# Patient Record
Sex: Male | Born: 1990 | Hispanic: Refuse to answer | Marital: Single | Smoking: Never smoker
Health system: Southern US, Community
[De-identification: ages and names within clinical notes are randomized; demographics above are authoritative.]

## PROBLEM LIST (undated history)

## (undated) DIAGNOSIS — E119 Type 2 diabetes mellitus without complications: Secondary | ICD-10-CM

## (undated) HISTORY — PX: HERNIA REPAIR: SHX51

## (undated) HISTORY — PX: TONSILLECTOMY: SUR1361

## (undated) HISTORY — PX: VASECTOMY: SHX75

---

## 2019-02-17 ENCOUNTER — Other Ambulatory Visit: Payer: Self-pay

## 2019-02-17 ENCOUNTER — Inpatient Hospital Stay (HOSPITAL_COMMUNITY)
Admission: EM | Admit: 2019-02-17 | Discharge: 2019-02-21 | DRG: 637 | Disposition: A | Discharge status: Home / Self Care | Payer: Self-pay | Attending: Internal Medicine | Admitting: Internal Medicine

## 2019-02-17 ENCOUNTER — Encounter (HOSPITAL_COMMUNITY): Payer: Self-pay | Admitting: *Deleted

## 2019-02-17 DIAGNOSIS — E876 Hypokalemia: Secondary | ICD-10-CM | POA: Diagnosis not present

## 2019-02-17 DIAGNOSIS — E1010 Type 1 diabetes mellitus with ketoacidosis without coma: Principal | ICD-10-CM | POA: Diagnosis present

## 2019-02-17 DIAGNOSIS — E872 Acidosis, unspecified: Secondary | ICD-10-CM | POA: Diagnosis present

## 2019-02-17 DIAGNOSIS — G9341 Metabolic encephalopathy: Secondary | ICD-10-CM | POA: Diagnosis present

## 2019-02-17 DIAGNOSIS — Z8249 Family history of ischemic heart disease and other diseases of the circulatory system: Secondary | ICD-10-CM

## 2019-02-17 DIAGNOSIS — R509 Fever, unspecified: Secondary | ICD-10-CM | POA: Diagnosis not present

## 2019-02-17 DIAGNOSIS — R000 Tachycardia, unspecified: Secondary | ICD-10-CM | POA: Diagnosis present

## 2019-02-17 DIAGNOSIS — N179 Acute kidney failure, unspecified: Secondary | ICD-10-CM | POA: Diagnosis present

## 2019-02-17 DIAGNOSIS — E873 Alkalosis: Secondary | ICD-10-CM | POA: Diagnosis present

## 2019-02-17 DIAGNOSIS — E860 Dehydration: Secondary | ICD-10-CM | POA: Diagnosis present

## 2019-02-17 DIAGNOSIS — Z833 Family history of diabetes mellitus: Secondary | ICD-10-CM

## 2019-02-17 DIAGNOSIS — E871 Hypo-osmolality and hyponatremia: Secondary | ICD-10-CM | POA: Diagnosis present

## 2019-02-17 DIAGNOSIS — K219 Gastro-esophageal reflux disease without esophagitis: Secondary | ICD-10-CM | POA: Diagnosis present

## 2019-02-17 DIAGNOSIS — Z20828 Contact with and (suspected) exposure to other viral communicable diseases: Secondary | ICD-10-CM | POA: Diagnosis present

## 2019-02-17 DIAGNOSIS — M79606 Pain in leg, unspecified: Secondary | ICD-10-CM

## 2019-02-17 HISTORY — DX: Type 2 diabetes mellitus without complications: E11.9

## 2019-02-17 LAB — COMPREHENSIVE METABOLIC PANEL
ALT: 17 U/L (ref 0–44)
AST: 21 U/L (ref 15–41)
Albumin: 3.7 g/dL (ref 3.5–5.0)
Alkaline Phosphatase: 122 U/L (ref 38–126)
BUN: 20 mg/dL (ref 6–20)
CO2: 5 mmol/L — ABNORMAL LOW (ref 22–32)
Calcium: 8.2 mg/dL — ABNORMAL LOW (ref 8.9–10.3)
Chloride: 104 mmol/L (ref 98–111)
Creatinine, Ser: 1.45 mg/dL — ABNORMAL HIGH (ref 0.61–1.24)
GFR calc Af Amer: >60 mL/min (ref >60)
GFR calc non Af Amer: >60 mL/min (ref >60)
Glucose, Bld: 383 mg/dL — ABNORMAL HIGH (ref 70–99)
Potassium: 3.7 mmol/L (ref 3.5–5.1)
Sodium: 131 mmol/L — ABNORMAL LOW (ref 135–145)
Total Bilirubin: 1.6 mg/dL — ABNORMAL HIGH (ref 0.3–1.2)
Total Protein: 6.7 g/dL (ref 6.5–8.1)

## 2019-02-17 LAB — CBC
HCT: 41 % (ref 39.0–52.0)
Hemoglobin: 14.3 g/dL (ref 13.0–17.0)
MCH: 32.1 pg (ref 26.0–34.0)
MCHC: 34.9 g/dL (ref 30.0–36.0)
MCV: 91.9 fL (ref 80.0–100.0)
Platelets: 152 10*3/uL (ref 150–400)
RBC: 4.46 MIL/uL (ref 4.22–5.81)
RDW: 17.9 % — ABNORMAL HIGH (ref 11.5–15.5)
WBC: 10 10*3/uL (ref 4.0–10.5)
nRBC: 0 % (ref 0.0–0.2)

## 2019-02-17 LAB — ETHANOL: Alcohol, Ethyl (B): <10 mg/dL (ref <10)

## 2019-02-17 LAB — BASIC METABOLIC PANEL
Anion gap: 12 (ref 5–15)
Anion gap: 11 (ref 5–15)
BUN: 21 mg/dL — ABNORMAL HIGH (ref 6–20)
BUN: 22 mg/dL — ABNORMAL HIGH (ref 6–20)
CO2: 7 mmol/L — ABNORMAL LOW (ref 22–32)
CO2: 9 mmol/L — ABNORMAL LOW (ref 22–32)
Calcium: 8.1 mg/dL — ABNORMAL LOW (ref 8.9–10.3)
Calcium: 8.7 mg/dL — ABNORMAL LOW (ref 8.9–10.3)
Chloride: 108 mmol/L (ref 98–111)
Chloride: 109 mmol/L (ref 98–111)
Creatinine, Ser: 1.28 mg/dL — ABNORMAL HIGH (ref 0.61–1.24)
Creatinine, Ser: 1.2 mg/dL (ref 0.61–1.24)
GFR calc Af Amer: >60 mL/min (ref >60)
GFR calc Af Amer: >60 mL/min (ref >60)
GFR calc non Af Amer: >60 mL/min (ref >60)
GFR calc non Af Amer: >60 mL/min (ref >60)
Glucose, Bld: 255 mg/dL — ABNORMAL HIGH (ref 70–99)
Glucose, Bld: 180 mg/dL — ABNORMAL HIGH (ref 70–99)
Potassium: 4.7 mmol/L (ref 3.5–5.1)
Potassium: 3 mmol/L — ABNORMAL LOW (ref 3.5–5.1)
Sodium: 127 mmol/L — ABNORMAL LOW (ref 135–145)
Sodium: 129 mmol/L — ABNORMAL LOW (ref 135–145)

## 2019-02-17 LAB — MRSA PCR SCREENING: MRSA by PCR: NEGATIVE

## 2019-02-17 LAB — BLOOD GAS, ARTERIAL
Acid-base deficit: 24 mmol/L — ABNORMAL HIGH (ref 0.0–2.0)
Bicarbonate: 7.1 mmol/L — ABNORMAL LOW (ref 20.0–28.0)
FIO2: 21
O2 Saturation: 60.3 %
Patient temperature: 37
pCO2 arterial: 19 mmHg — CL (ref 32.0–48.0)
pH, Arterial: 7.031 — CL (ref 7.350–7.450)
pO2, Arterial: 36 mmHg — CL (ref 83.0–108.0)

## 2019-02-17 LAB — GLUCOSE, CAPILLARY
Glucose-Capillary: 217 mg/dL — ABNORMAL HIGH (ref 70–99)
Glucose-Capillary: 190 mg/dL — ABNORMAL HIGH (ref 70–99)
Glucose-Capillary: 196 mg/dL — ABNORMAL HIGH (ref 70–99)
Glucose-Capillary: 196 mg/dL — ABNORMAL HIGH (ref 70–99)
Glucose-Capillary: 174 mg/dL — ABNORMAL HIGH (ref 70–99)
Glucose-Capillary: 160 mg/dL — ABNORMAL HIGH (ref 70–99)
Glucose-Capillary: 157 mg/dL — ABNORMAL HIGH (ref 70–99)
Glucose-Capillary: 135 mg/dL — ABNORMAL HIGH (ref 70–99)

## 2019-02-17 LAB — RAPID URINE DRUG SCREEN, HOSP PERFORMED
Amphetamines: NOT DETECTED
Barbiturates: NOT DETECTED
Benzodiazepines: NOT DETECTED
Cocaine: NOT DETECTED
Opiates: NOT DETECTED
Tetrahydrocannabinol: NOT DETECTED

## 2019-02-17 LAB — CBG MONITORING, ED
Glucose-Capillary: 425 mg/dL — ABNORMAL HIGH (ref 70–99)
Glucose-Capillary: 392 mg/dL — ABNORMAL HIGH (ref 70–99)
Glucose-Capillary: 362 mg/dL — ABNORMAL HIGH (ref 70–99)
Glucose-Capillary: 226 mg/dL — ABNORMAL HIGH (ref 70–99)
Glucose-Capillary: 255 mg/dL — ABNORMAL HIGH (ref 70–99)
Glucose-Capillary: 236 mg/dL — ABNORMAL HIGH (ref 70–99)

## 2019-02-17 LAB — URINALYSIS, ROUTINE W REFLEX MICROSCOPIC
Bacteria, UA: NONE SEEN
Bilirubin Urine: NEGATIVE
Glucose, UA: >=500 mg/dL — AB
Ketones, ur: 80 mg/dL — AB
Leukocytes,Ua: NEGATIVE
Nitrite: NEGATIVE
Protein, ur: 100 mg/dL — AB
Specific Gravity, Urine: 1.021 (ref 1.005–1.030)
pH: 5 (ref 5.0–8.0)

## 2019-02-17 LAB — HIV ANTIBODY (ROUTINE TESTING W REFLEX): HIV Screen 4th Generation wRfx: NONREACTIVE

## 2019-02-17 LAB — HEMOGLOBIN A1C
Hgb A1c MFr Bld: 12 % — ABNORMAL HIGH (ref 4.8–5.6)
Mean Plasma Glucose: 297.7 mg/dL

## 2019-02-17 LAB — LACTIC ACID, PLASMA: Lactic Acid, Venous: 1.7 mmol/L (ref 0.5–1.9)

## 2019-02-17 LAB — TSH: TSH: 1.37 u[IU]/mL (ref 0.350–4.500)

## 2019-02-17 LAB — MAGNESIUM: Magnesium: 2.1 mg/dL (ref 1.7–2.4)

## 2019-02-17 LAB — SARS CORONAVIRUS 2 (TAT 6-24 HRS): SARS Coronavirus 2: NEGATIVE

## 2019-02-17 LAB — TROPONIN I (HIGH SENSITIVITY): Troponin I (High Sensitivity): 5 ng/L (ref <18)

## 2019-02-17 LAB — PHOSPHORUS: Phosphorus: 3.2 mg/dL (ref 2.5–4.6)

## 2019-02-17 MED ORDER — SODIUM CHLORIDE 0.9 % IV BOLUS
1000.0000 mL | Freq: Once | INTRAVENOUS | Status: AC
  Administered 2019-02-17: 1000 mL via INTRAVENOUS

## 2019-02-17 MED ORDER — INSULIN REGULAR(HUMAN) IN NACL 100-0.9 UT/100ML-% IV SOLN
INTRAVENOUS | Status: DC
  Administered 2019-02-17: 3.3 [IU]/h via INTRAVENOUS

## 2019-02-17 MED ORDER — PANTOPRAZOLE SODIUM 40 MG PO TBEC
40.0000 mg | DELAYED_RELEASE_TABLET | Freq: Every day | ORAL | Status: DC
  Administered 2019-02-17 – 2019-02-21 (×3): 40 mg via ORAL
  Filled 2019-02-17 (×4): qty 1

## 2019-02-17 MED ORDER — HEPARIN SODIUM (PORCINE) 5000 UNIT/ML IJ SOLN
5000.0000 [IU] | Freq: Three times a day (TID) | INTRAMUSCULAR | Status: DC
  Administered 2019-02-17 – 2019-02-21 (×3): 5000 [IU] via SUBCUTANEOUS
  Filled 2019-02-17 (×6): qty 1

## 2019-02-17 MED ORDER — INSULIN REGULAR(HUMAN) IN NACL 100-0.9 UT/100ML-% IV SOLN
INTRAVENOUS | Status: DC
  Administered 2019-02-17: 8 [IU]/h via INTRAVENOUS
  Administered 2019-02-17: 7.8 [IU]/h via INTRAVENOUS
  Administered 2019-02-17: 6 [IU]/h via INTRAVENOUS
  Administered 2019-02-19: 3.8 [IU]/h via INTRAVENOUS
  Filled 2019-02-17 (×2): qty 100

## 2019-02-17 MED ORDER — MAGNESIUM SULFATE 2 GM/50ML IV SOLN
2.0000 g | INTRAVENOUS | Status: AC
  Administered 2019-02-17: 2 g via INTRAVENOUS
  Filled 2019-02-17: qty 50

## 2019-02-17 MED ORDER — ONDANSETRON HCL 4 MG/2ML IJ SOLN: 4.0000 mg | Freq: Four times a day (QID) | INTRAMUSCULAR | Status: DC | PRN

## 2019-02-17 MED ORDER — DEXTROSE-NACL 5-0.45 % IV SOLN
INTRAVENOUS | Status: DC
  Administered 2019-02-17: 17:00:00 via INTRAVENOUS

## 2019-02-17 MED ORDER — POTASSIUM CHLORIDE 10 MEQ/100ML IV SOLN
10.0000 meq | INTRAVENOUS | Status: DC
  Administered 2019-02-17: 10 meq via INTRAVENOUS
  Filled 2019-02-17: qty 100

## 2019-02-17 MED ORDER — SODIUM CHLORIDE 0.9 % IV SOLN: Freq: Once | INTRAVENOUS | Status: DC

## 2019-02-17 MED ORDER — POTASSIUM CHLORIDE 10 MEQ/100ML IV SOLN
10.0000 meq | INTRAVENOUS | Status: AC
  Administered 2019-02-17: 10 meq via INTRAVENOUS
  Filled 2019-02-17: qty 100

## 2019-02-17 MED ORDER — SODIUM CHLORIDE 0.9 % IV SOLN
INTRAVENOUS | Status: DC
  Administered 2019-02-17: 12:00:00 via INTRAVENOUS

## 2019-02-17 MED ORDER — ACETAMINOPHEN 325 MG PO TABS: 650.0000 mg | ORAL_TABLET | Freq: Four times a day (QID) | ORAL | Status: DC | PRN

## 2019-02-17 MED ORDER — CHLORHEXIDINE GLUCONATE CLOTH 2 % EX PADS
6.0000 | MEDICATED_PAD | Freq: Every day | CUTANEOUS | Status: DC
  Administered 2019-02-17 – 2019-02-21 (×5): 6 via TOPICAL

## 2019-02-17 NOTE — H&P (Signed)
History and Physical    Bronco Mcgrory VPX:[id number] DOB: Jul 23, 1990 DOA: 02/17/2019  Referring MD/NP/PA: Dr. Sabra Heck PCP: Patient, No Pcp Per  Patient coming from: Home  Chief Complaint: Feeling confused, polydipsia, polyphagia, polyuria.  HPI: Becky Berberian is a 28 y.o. male without past medical history; who presented to the emergency department secondary to not feeling well and recent elevated blood sugars.  Patient reports never been diagnosed with diabetes, he reports tachypnea, severe dry mouth, polyuria and polydipsia.  Patient reports having some headaches and not feeling well.  He denies chest pain, fever, chills, shortness of breath, nausea, vomiting, dysuria, hematuria, focal weakness or any other complaints.  Patient reports been in the process of traveling through town trying to get to his sister house in [place]; but felt significantly ill slightly confused, he called paramedics secondary to these ongoing conditions.  When EMS arrived they found patients blood sugar was found in the mid 300 range.  He was tachypneic and presented significant dry mouth.  Patient reports not taking the medications and express all his symptoms have started in the last 24 to 36 hours prior to admission.  Patient denies any alcohol, drug use, tobacco abuse or allergies to any medications. In the ED patient was found to be in DKA, CO2 was 5, blood sugar in the 380 range and anion gap of 26.  IV fluids and insulin drip was initiated.  [facility] was contacted to admit patient for further evaluation and management of newly diagnosed diabetes with DKA presentation.  Past Medical/Surgical History: Past Medical History:  Diagnosis Date  . Diabetes mellitus without complication [facility])     Past Surgical History:  Procedure Laterality Date  . HERNIA REPAIR    . TONSILLECTOMY    . VASECTOMY      Social History:  reports that he has never smoked. He has never used smokeless tobacco. He reports previous alcohol use.  He reports previous drug use.  Allergies: No Known Allergies  Family History:  After discussing with the patient he expressed a family history of hypertension and some second degree history of diabetes.  Prior to Admission medications   Not on File    Review of Systems:  Negative except as otherwise mentioned in HPI.    Physical Exam: Vitals:   02/17/19 0656 02/17/19 0800 02/17/19 0930  BP:  132/86 119/80  Pulse:  (!) 122 (!) 122  Resp:  (!) 28 (!) 30  SpO2:  96% 99%  Weight: 61.7 kg    Height: 5\' 6"  (1.676 m)      Constitutional: Mildly confused (regarding insight), with visible tachypnea, no chest pain, no nausea, no vomiting.  Afebrile. Eyes: PERRL, lids and conjunctivae normal, no icterus, no nystagmus. ENMT: Mucous membranes dry on exam. Posterior pharynx clear of any exudate or lesions. Neck: normal, supple, no masses, no thyromegaly, no JVD. Respiratory: clear to auscultation bilaterally, no wheezing, no crackles.  Positive tachypnea. No accessory muscle use.  Cardiovascular: Sinus tachycardia, no murmurs, rubs or gallops. No extremity edema. 2+ pedal pulses. No carotid bruits.  Abdomen: no tenderness, no masses palpated. No hepatosplenomegaly. Bowel sounds positive.  Musculoskeletal: no clubbing / cyanosis. No joint deformity upper and lower extremities. Good ROM, no contractures. Normal muscle tone.  Skin: no rashes, lesions, ulcers. No induration Neurologic: CN 2-12 grossly intact. Sensation intact, DTR normal. Strength 5/5 in all 4.  Psychiatric: slightly impaired insight currently. Alert and oriented x 3. Normal mood.    Labs on Admission: I have  personally reviewed the following labs and imaging studies  CBC: Recent Labs  Lab 02/17/19 0828  WBC 10.0  HGB 14.3  HCT 41.0  MCV 91.9  PLT 152   Basic Metabolic Panel: Recent Labs  Lab 02/17/19 0742  NA 131*  K 3.7  CL 104  CO2 5*  GLUCOSE 383*  BUN 20  CREATININE 1.45*  CALCIUM 8.2*  MG 2.1    GFR: Estimated Creatinine Clearance: 66.8 mL/min (A) (by C-G formula based on SCr of 1.45 mg/dL (H)).   Liver Function Tests: Recent Labs  Lab 02/17/19 0742  AST 21  ALT 17  ALKPHOS 122  BILITOT 1.6*  PROT 6.7  ALBUMIN 3.7   CBG: Recent Labs  Lab 02/17/19 0730 02/17/19 1004  GLUCAP 425* 392*   Urine analysis:    Component Value Date/Time   COLORURINE STRAW (A) 02/17/2019 0721   APPEARANCEUR CLEAR 02/17/2019 0721   LABSPEC 1.021 02/17/2019 0721   PHURINE 5.0 02/17/2019 0721   GLUCOSEU >=500 (A) 02/17/2019 0721   HGBUR MODERATE (A) 02/17/2019 0721   BILIRUBINUR NEGATIVE 02/17/2019 0721   KETONESUR 80 (A) 02/17/2019 0721   PROTEINUR 100 (A) 02/17/2019 0721   NITRITE NEGATIVE 02/17/2019 0721   LEUKOCYTESUR NEGATIVE 02/17/2019 0721    Recent Results (from the past 240 hour(s))  Blood culture (routine x 2)     Status: None (Preliminary result)   Collection Time: 02/17/19  8:18 AM   Specimen: BLOOD  Result Value Ref Range Status   Specimen Description BLOOD LEFT ARM  Final   Special Requests   Final    BOTTLES DRAWN AEROBIC AND ANAEROBIC Blood Culture adequate volume Performed at [facility], [address] [place], [place] [address]    Culture PENDING  Incomplete   Report Status PENDING  Incomplete  Blood culture (routine x 2)     Status: None (Preliminary result)   Collection Time: 02/17/19  8:28 AM   Specimen: BLOOD  Result Value Ref Range Status   Specimen Description BLOOD LEFT HAND  Final   Special Requests   Final    BOTTLES DRAWN AEROBIC ONLY Blood Culture results may not be optimal due to an inadequate volume of blood received in culture bottles Performed at [facility], [address] [place], [place] [address]    Culture PENDING  Incomplete   Report Status PENDING  Incomplete     Radiological Exams on Admission: No results found.  EKG: Independently reviewed.  Sinus tachycardia, normal QT.  Normal axis.  Nonspecific T wave abnormalities.   Assessment/Plan 1-Newly diagnosed diabetes with hyperglycemia and DKA -Most likely type 1 diabetes -Patient will receive aggressive IV fluids and IV insulin therapy -N.p.o. -Check TSH and troponin -Check A1c, PO4 and Mg level -On presentation CO2 was 5, CBGs in the 380 range and anion gap 26 -Will follow DKA protocol to transition patient off insulin drip. -COVID-19 test neg   2-mild renal insufficiency -In the setting of dehydration from DKA process -Continue aggressive fluid resuscitation and follow renal function trend.  3-hyponatremia -In the setting of dehydration and hyperglycemia -Continue IV fluids -Follow electrolytes.  4-GI prophylaxis -Started on PPI.  5-mild metabolic encephalopathy in the setting of hyperglycemia and DKA -Continue treatment for DKA process and follow mentation. -Patient able to follow commands appropriately. -No headaches, no photophobia, no meningismus.  6-severe acidosis -ABG demonstrating a pH of 7.0 -Secondary to DKA process -Patient with compensatory respiratory alkalosis -Continue treatment for DKA and follow clinical response.  7-sinus tachycardia -  In the setting of dehydration and acute DKA process -Continue treatment with IV fluids and insulin treatment as mentioned above -Telemetry monitoring in place.   DVT prophylaxis: heparin  Code Status: Full Code Family Communication: no family at bedside; ED physician discussed plan of care with sister over the phone. Disposition Plan: home once medically stable.  Consults called: none  Admission status: Inpatient, stepdown unit; length of stay more than 2 midnights.   Time Spent: 70 minutes.  Vassie Loll MD [facility] Pager [phone]  02/17/2019, 10:24 AM

## 2019-02-17 NOTE — ED Triage Notes (Signed)
Pt brought in by [facility] for c/o altered loc; pt was picked up at a local hotel; pt appears tachypneic and anxious; pt denies any pain at this time; pt's cbg with ems was 240

## 2019-02-17 NOTE — ED Notes (Signed)
Pt does not want to stay. States he doesn't like to take medications and this is all due to dietary reasons. Have explained to him DKA extensively. Have consulted Dr. Sabra Heck.

## 2019-02-17 NOTE — ED Notes (Signed)
Urinal at bedside.  

## 2019-02-17 NOTE — ED Provider Notes (Signed)
[facility] EMERGENCY DEPARTMENT Provider Note   CSN: [id number] Arrival date & time: 02/17/19  0645     History   Chief Complaint Chief Complaint  Patient presents with  . Altered Mental Status    HPI Victor Parsons is a 28 y.o. male.     HPI  This patient is a 28 year old male, he presents to the hospital in the care of the paramedics after he called out for feeling ill.  Apparently the patient states to me that he has never been diagnosed with diabetes other than self diagnosing himself.  He reports that he has been reading about it, he feels like he has "myelin sheath problem" and will often have pain in his feet.  However the primary complaint tonight is that the patient feels like he is having tachypnea, he is having a severe dry mouth and is having to urinate more frequently with some discomfort with urination.  He denies chest pain, denies abdominal pain, he states he has a headache, feels severely dehydrated.  He is currently traveling through town trying to get to his sister's house in [place] and was staying at a hotel where he was found by the paramedics in this condition.  He was reportedly hyperglycemic at 240 or thereabouts, reportedly tachypneic and dry in the mouth, no medications were given prehospital.  He states that his symptoms of started in the last 24 hours.  He denies taking any daily medications, denies any prior diagnosis of any chronic medical conditions, states that he has had a vasectomy, tonsillectomy and an inguinal hernia repair in the past.  Denies taking any daily medicines, denies any allergies to medications.  Denies any alcohol or drug use and does not smoke cigarettes.  Past Medical History:  Diagnosis Date  . Diabetes mellitus without complication (HCC)     There are no active problems to display for this patient.   Past Surgical History:  Procedure Laterality Date  . HERNIA REPAIR    . TONSILLECTOMY    . VASECTOMY          Home  Medications    Prior to Admission medications   Not on File    Family History History reviewed. No pertinent family history.  Social History Social History   Tobacco Use  . Smoking status: Never Smoker  . Smokeless tobacco: Never Used  Substance Use Topics  . Alcohol use: Not Currently  . Drug use: Not Currently     Allergies   Patient has no known allergies.   Review of Systems Review of Systems  All other systems reviewed and are negative.    Physical Exam Updated Vital Signs Ht 1.676 m (5\' 6" )   Wt 61.7 kg   BMI 21.95 kg/m   Physical Exam Vitals signs and nursing note reviewed.  Constitutional:      General: He is in acute distress.     Appearance: He is well-developed.  HENT:     Head: Normocephalic and atraumatic.     Mouth/Throat:     Mouth: Mucous membranes are dry.     Pharynx: No oropharyngeal exudate.     Comments: Very dry mucous membranes Eyes:     General: No scleral icterus.       Right eye: No discharge.        Left eye: No discharge.     Conjunctiva/sclera: Conjunctivae normal.     Pupils: Pupils are equal, round, and reactive to light.  Neck:  Musculoskeletal: Normal range of motion and neck supple.     Thyroid: No thyromegaly.     Vascular: No JVD.  Cardiovascular:     Rate and Rhythm: Regular rhythm. Tachycardia present.     Heart sounds: Normal heart sounds. No murmur. No friction rub. No gallop.   Pulmonary:     Effort: Respiratory distress present.     Breath sounds: Normal breath sounds. No wheezing or rales.  Abdominal:     General: Bowel sounds are normal. There is no distension.     Palpations: Abdomen is soft. There is no mass.     Tenderness: There is no abdominal tenderness.     Comments: Nontender abdomen, no hepatosplenomegaly  Musculoskeletal: Normal range of motion.        General: No tenderness.     Comments: Compartments are soft and joints are supple diffusely, there is no tenderness, no obvious deformities   Lymphadenopathy:     Cervical: No cervical adenopathy.  Skin:    General: Skin is warm and dry.     Findings: No erythema or rash.     Comments: Dry skin, no rashes seen  Neurological:     Mental Status: He is alert.     Coordination: Coordination normal.     Comments: The patient is able to sit up and move around in the bed, he speaks but in short and sentences secondary to the tachypnea, has normal coordination and follows commands without difficulty.  Psychiatric:        Behavior: Behavior normal.      ED Treatments / Results  Labs (all labs ordered are listed, but only abnormal results are displayed) Labs Reviewed  COMPREHENSIVE METABOLIC PANEL - Abnormal; Notable for the following components:      Result Value   Sodium 131 (*)    CO2 5 (*)    Glucose, Bld 383 (*)    Creatinine, Ser 1.45 (*)    Calcium 8.2 (*)    Total Bilirubin 1.6 (*)    All other components within normal limits  BLOOD GAS, ARTERIAL - Abnormal; Notable for the following components:   pH, Arterial 7.031 (*)    pCO2 arterial 19.0 (*)    pO2, Arterial 36.0 (*)    Bicarbonate 7.1 (*)    Acid-base deficit 24.0 (*)    All other components within normal limits  CBC - Abnormal; Notable for the following components:   RDW 17.9 (*)    All other components within normal limits  CBG MONITORING, ED - Abnormal; Notable for the following components:   Glucose-Capillary 425 (*)    All other components within normal limits  CULTURE, BLOOD (ROUTINE X 2)  CULTURE, BLOOD (ROUTINE X 2)  URINE CULTURE  SARS CORONAVIRUS 2 (TAT 6-24 HRS)  LACTIC ACID, PLASMA  MAGNESIUM  ETHANOL  URINALYSIS, ROUTINE W REFLEX MICROSCOPIC  VOLATILES,BLD-ACETONE,ETHANOL,ISOPROP,METHANOL  OSMOLALITY  RAPID URINE DRUG SCREEN, HOSP PERFORMED    EKG EKG Interpretation  Date/Time:  Thursday February 17 2019 07:35:06 EDT Ventricular Rate:  124 PR Interval:    QRS Duration: 105 QT Interval:  291 QTC Calculation: 418 R Axis:   85  Text Interpretation:  Sinus tachycardia Nonspecific T abnrm, anterolateral leads Baseline wander in lead(s) I aVR V2 No old tracing to compare Confirmed by Eber Hong ([id number]) on 02/17/2019 7:50:15 AM   Radiology No results found.  Procedures .Critical Care Performed by: Eber Hong, MD Authorized by: Eber Hong, MD   Critical care provider statement:  Critical care time (minutes):  35   Critical care time was exclusive of:  Separately billable procedures and treating other patients and teaching time   Critical care was necessary to treat or prevent imminent or life-threatening deterioration of the following conditions:  Endocrine crisis   Critical care was time spent personally by me on the following activities:  Blood draw for specimens, development of treatment plan with patient or surrogate, discussions with consultants, evaluation of patient's response to treatment, examination of patient, obtaining history from patient or surrogate, ordering and performing treatments and interventions, ordering and review of laboratory studies, ordering and review of radiographic studies, pulse oximetry, re-evaluation of patient's condition and review of old charts   (including critical care time)  Medications Ordered in ED Medications  sodium chloride 0.9 % bolus 1,000 mL (has no administration in time range)  sodium chloride 0.9 % bolus 1,000 mL (has no administration in time range)     Initial Impression / Assessment and Plan / ED Course  I have reviewed the triage vital signs and the nursing notes.  Pertinent labs & imaging results that were available during my care of the patient were reviewed by me and considered in my medical decision making (see chart for details).  Clinical Course as of Feb 17 1031  Thu Feb 17, 2019  5361 My interpretation of the EKG shows nonspecific T waves, tachycardia consistent with dehydrated state likely some potassium abnormality, CBG reviewed showing  over 400, more likely to be DKA at this point, other labs pending, IV fluids infusing at this time   [BM]  0849 The VBG is consistent with an acidotic state, the CO2 is 19, pH is 7.03, PO2 of 36, bicarbonate is 7.1.  There is a large acid-base deficit all consistent with acidosis.  Pending potassium prior to starting insulin, IV fluids are being given, tachycardia is somewhat improving   [BM]  0912 K is 3.7, K will be replaced - Cr of 1.45, AKI  Glucose ~ 400 and CO2, < 5 c/w severe acidosis.   [BM]  0925 CO2(!): 5 [BM]  0946 I discussed the patients care with his sister in [place] - she reports that she was asking him to come up there to take care of him as they think that he has some type of Autism or underlyilng mental health disorder based on some mild behavioural abnormalities (doesn't specify), she will be able to come here to pick him up after stabilized during hospitalization - the patient is expressing his desire to go home and handle this with dietary modifications, he does not understand the gravity of the situation thus I had a very long discussion with the patient regarding risks benefits and the lack of alternatives for a safe outcome.  He is now agreeable to stay, paged hospitalist   [BM]    Clinical Course User Index [BM] Noemi Chapel, MD       It appears that the patient is having a metabolic problem, he is very tachypneic, he appears very dry in the mouth and by all accounts this would be consistent with an acidotic state, that being said he is not a known diabetic, states he does not take any medications for that and has only recently "self diagnosed".  At this time the patient will need IV fluid resuscitation as well as a metabolic work-up, he may in fact be metabolically ill, will get ABG, metabolic panel, EKG, cardiac monitoring, IV fluids, insulin as needed if in fact  this ends up being DKA.  We will also check for volatile acids and serum osmolality.  The patient is agreeable  with the plan.  The patient's significant acidosis will be treated with IV fluids insulin drip, replacement of potassium and admission to high level of care.  He continues to be tachycardic, acidotic and ill-appearing, will likely need stepdown  Critical care provided  D/w Dr. Gwenlyn PerkingMadera who will admit  Vitals:   02/17/19 0656 02/17/19 0800  BP:  132/86  Pulse:  (!) 122  Resp:  (!) 28  SpO2:  96%  Weight: 61.7 kg   Height: 1.676 m (5\' 6" )      Final Clinical Impressions(s) / ED Diagnoses   Final diagnoses:  Diabetic ketoacidosis without coma associated with type 1 diabetes mellitus (HCC)  AKI (acute kidney injury) (HCC)      Eber HongMiller, Bauer Ausborn, MD 02/17/19 1032

## 2019-02-17 NOTE — ED Notes (Signed)
Crit CRITICAL VALUE ALERT  Critical Value:  pH 7.031, pCO2 19, PO2 36   Date & Time Notied:  02/17/2019 0834  Provider Notified: Dr. Sabra Heck   Orders Received/Actions taken: None yet

## 2019-02-18 LAB — BASIC METABOLIC PANEL
Anion gap: 7 (ref 5–15)
Anion gap: 9 (ref 5–15)
Anion gap: 9 (ref 5–15)
Anion gap: 9 (ref 5–15)
Anion gap: 9 (ref 5–15)
BUN: 16 mg/dL (ref 6–20)
BUN: 19 mg/dL (ref 6–20)
BUN: 20 mg/dL (ref 6–20)
BUN: 24 mg/dL — ABNORMAL HIGH (ref 6–20)
BUN: 25 mg/dL — ABNORMAL HIGH (ref 6–20)
CO2: 11 mmol/L — ABNORMAL LOW (ref 22–32)
CO2: 11 mmol/L — ABNORMAL LOW (ref 22–32)
CO2: 13 mmol/L — ABNORMAL LOW (ref 22–32)
CO2: 13 mmol/L — ABNORMAL LOW (ref 22–32)
CO2: 15 mmol/L — ABNORMAL LOW (ref 22–32)
Calcium: 8.8 mg/dL — ABNORMAL LOW (ref 8.9–10.3)
Calcium: 8.8 mg/dL — ABNORMAL LOW (ref 8.9–10.3)
Calcium: 8.9 mg/dL (ref 8.9–10.3)
Calcium: 9.2 mg/dL (ref 8.9–10.3)
Calcium: 9.6 mg/dL (ref 8.9–10.3)
Chloride: 101 mmol/L (ref 98–111)
Chloride: 102 mmol/L (ref 98–111)
Chloride: 104 mmol/L (ref 98–111)
Chloride: 106 mmol/L (ref 98–111)
Chloride: 109 mmol/L (ref 98–111)
Creatinine, Ser: 1.26 mg/dL — ABNORMAL HIGH (ref 0.61–1.24)
Creatinine, Ser: 1.28 mg/dL — ABNORMAL HIGH (ref 0.61–1.24)
Creatinine, Ser: 1.31 mg/dL — ABNORMAL HIGH (ref 0.61–1.24)
Creatinine, Ser: 1.31 mg/dL — ABNORMAL HIGH (ref 0.61–1.24)
Creatinine, Ser: 1.31 mg/dL — ABNORMAL HIGH (ref 0.61–1.24)
GFR calc Af Amer: >60 mL/min (ref >60)
GFR calc Af Amer: >60 mL/min (ref >60)
GFR calc Af Amer: >60 mL/min (ref >60)
GFR calc Af Amer: >60 mL/min (ref >60)
GFR calc Af Amer: >60 mL/min (ref >60)
GFR calc non Af Amer: >60 mL/min (ref >60)
GFR calc non Af Amer: >60 mL/min (ref >60)
GFR calc non Af Amer: >60 mL/min (ref >60)
GFR calc non Af Amer: >60 mL/min (ref >60)
GFR calc non Af Amer: >60 mL/min (ref >60)
Glucose, Bld: 118 mg/dL — ABNORMAL HIGH (ref 70–99)
Glucose, Bld: 133 mg/dL — ABNORMAL HIGH (ref 70–99)
Glucose, Bld: 145 mg/dL — ABNORMAL HIGH (ref 70–99)
Glucose, Bld: 163 mg/dL — ABNORMAL HIGH (ref 70–99)
Glucose, Bld: 184 mg/dL — ABNORMAL HIGH (ref 70–99)
Potassium: 2.7 mmol/L — CL (ref 3.5–5.1)
Potassium: 2.9 mmol/L — ABNORMAL LOW (ref 3.5–5.1)
Potassium: 2.9 mmol/L — ABNORMAL LOW (ref 3.5–5.1)
Potassium: 3.7 mmol/L (ref 3.5–5.1)
Potassium: 3.7 mmol/L (ref 3.5–5.1)
Sodium: 121 mmol/L — ABNORMAL LOW (ref 135–145)
Sodium: 124 mmol/L — ABNORMAL LOW (ref 135–145)
Sodium: 126 mmol/L — ABNORMAL LOW (ref 135–145)
Sodium: 128 mmol/L — ABNORMAL LOW (ref 135–145)
Sodium: 129 mmol/L — ABNORMAL LOW (ref 135–145)

## 2019-02-18 LAB — GLUCOSE, CAPILLARY
Glucose-Capillary: 113 mg/dL — ABNORMAL HIGH (ref 70–99)
Glucose-Capillary: 124 mg/dL — ABNORMAL HIGH (ref 70–99)
Glucose-Capillary: 127 mg/dL — ABNORMAL HIGH (ref 70–99)
Glucose-Capillary: 133 mg/dL — ABNORMAL HIGH (ref 70–99)
Glucose-Capillary: 136 mg/dL — ABNORMAL HIGH (ref 70–99)
Glucose-Capillary: 139 mg/dL — ABNORMAL HIGH (ref 70–99)
Glucose-Capillary: 144 mg/dL — ABNORMAL HIGH (ref 70–99)
Glucose-Capillary: 145 mg/dL — ABNORMAL HIGH (ref 70–99)
Glucose-Capillary: 146 mg/dL — ABNORMAL HIGH (ref 70–99)
Glucose-Capillary: 147 mg/dL — ABNORMAL HIGH (ref 70–99)
Glucose-Capillary: 152 mg/dL — ABNORMAL HIGH (ref 70–99)
Glucose-Capillary: 159 mg/dL — ABNORMAL HIGH (ref 70–99)
Glucose-Capillary: 166 mg/dL — ABNORMAL HIGH (ref 70–99)
Glucose-Capillary: 167 mg/dL — ABNORMAL HIGH (ref 70–99)
Glucose-Capillary: 169 mg/dL — ABNORMAL HIGH (ref 70–99)
Glucose-Capillary: 174 mg/dL — ABNORMAL HIGH (ref 70–99)
Glucose-Capillary: 177 mg/dL — ABNORMAL HIGH (ref 70–99)
Glucose-Capillary: 179 mg/dL — ABNORMAL HIGH (ref 70–99)
Glucose-Capillary: 184 mg/dL — ABNORMAL HIGH (ref 70–99)
Glucose-Capillary: 195 mg/dL — ABNORMAL HIGH (ref 70–99)
Glucose-Capillary: 198 mg/dL — ABNORMAL HIGH (ref 70–99)
Glucose-Capillary: 203 mg/dL — ABNORMAL HIGH (ref 70–99)

## 2019-02-18 LAB — URINE CULTURE: Culture: NO GROWTH

## 2019-02-18 MED ORDER — POTASSIUM CHLORIDE 10 MEQ/100ML IV SOLN
10.0000 meq | INTRAVENOUS | Status: AC
  Administered 2019-02-18 – 2019-02-19 (×4): 10 meq via INTRAVENOUS
  Filled 2019-02-18 (×4): qty 100

## 2019-02-18 MED ORDER — POTASSIUM CHLORIDE CRYS ER 20 MEQ PO TBCR
40.0000 meq | EXTENDED_RELEASE_TABLET | Freq: Once | ORAL | Status: AC
  Administered 2019-02-18: 40 meq via ORAL
  Filled 2019-02-18: qty 2

## 2019-02-18 MED ORDER — LIVING WELL WITH DIABETES BOOK
Freq: Once | Status: AC
  Administered 2019-02-18: 09:00:00

## 2019-02-18 MED ORDER — POTASSIUM CHLORIDE 10 MEQ/100ML IV SOLN
10.0000 meq | INTRAVENOUS | Status: AC
  Administered 2019-02-18 (×4): 10 meq via INTRAVENOUS
  Filled 2019-02-18 (×4): qty 100

## 2019-02-18 MED ORDER — DEXTROSE-NACL 5-0.9 % IV SOLN
INTRAVENOUS | Status: DC
  Administered 2019-02-18 – 2019-02-19 (×3): via INTRAVENOUS

## 2019-02-18 NOTE — Discharge Instructions (Signed)
Glucose Products:  ReliOn glucose products raise low blood sugar fast. Tablets are free of fat, caffeine, sodium and gluten. They are portable and easy to carry, making it easier for people with diabetes to BE PREPARED for lows.  Glucose Tablets Available in 6 flavors  10 ct...................................... $1.00  50 ct...................................... $3.98 Glucose Shot..................................$1.48 Glucose Gel....................................$3.44  Alcohol Swabs Alcohol swabs are used to sterilize your injection site. All of our swabs are individually wrapped for maximum safety, convenience and moisture retention. ReliOn Alcohol Swabs  100 ct Swabs..............................$1.00  400 ct Swabs..............................$3.74  Lancets ReliOn offers three lancet options conveniently designed to work with almost every lancing device. Each features a protective disk, which guarantees sterility before testing. ReliOn Lancets  100 ct Lancets $1.56  200 ct Lancets $2.64 Available in Ultra-Thin, Thin & Micro-Thin ReliOn 2-IN-1 Lancing Device  50 ct Lancets..................................... $3.44 Available in 30 gauge and 25 gauge ReliOn Lancing Device....................$5.84  Blood Glucose Monitors ReliOn offers a full range of blood glucose testing options to provide an accurate, affordable system that meets each person's unique needs and preferences. Prime Meter....................................... $9.00 Prime Test Strips  25 test strips.................................... $5.00  50 test strips.................................... $9.00  100 test strips.................................$17.88 Premier [employer] Meter    $18.98 Premier Voice Meter  .  $14.98 Premier Test Strips  50 test strips.................................... $9.00  100 test strips.................................$17.88 Premier [employer] Kit     $19.44 Kit includes:  50 test strips  10 lancets  Lancing device  Carry case  Ketone Test Strips  50 test strips  ..  $6.64  Human Insulin  Novolin/ReliOn (recombinant DNA origin) is manufactured for [employer] by [employer] Insulin* with Vial..........$24.88 Available in N, R, 70/30 Novolin/ReliOn Insulin Pens*    $42.88 Available in N, R, 70/30  Insulin Delivery ReliOn syringes and pen needles provide precision technology, comfort and accuracy in insulin delivery at affordable prices. ReliOn Pen Needles*  50 ct....................................................$9.00 Available in 22m, 650m 19m61m 95m60mliOn Insulin Syringes*   100 ct . $12.58 Available in 29G, 30G & 31G (3/10cc, 1/2cc & 1cc units)

## 2019-02-18 NOTE — Progress Notes (Signed)
Mid level aware of patient's potassium.

## 2019-02-18 NOTE — Progress Notes (Signed)
PROGRESS NOTE    Victor Parsons  ZJQ:[id number] DOB: 08-30-1990 DOA: 02/17/2019 PCP: Patient, No Pcp Per     Brief Narrative:  28 y.o. male without past medical history; who presented to the emergency department secondary to not feeling well and recent elevated blood sugars.  Patient reports never been diagnosed with diabetes, he reports tachypnea, severe dry mouth, polyuria and polydipsia.  Patient reports having some headaches and not feeling well.  He denies chest pain, fever, chills, shortness of breath, nausea, vomiting, dysuria, hematuria, focal weakness or any other complaints.  Patient reports been in the process of traveling through town trying to get to his sister house in [place]; but felt significantly ill slightly confused, he called paramedics secondary to these ongoing conditions.  When EMS arrived they found patients blood sugar was found in the mid 300 range.  He was tachypneic and presented significant dry mouth.  Patient reports not taking the medications and express all his symptoms have started in the last 24 to 36 hours prior to admission.  Patient denies any alcohol, drug use, tobacco abuse or allergies to any medications. In the ED patient was found to be in DKA, CO2 was 5, blood sugar in the 380 range and anion gap of 26.  IV fluids and insulin drip was initiated.  TR[facility] was contacted to admit patient for further evaluation and management of newly diagnosed diabetes with DKA presentation.    Assessment & Plan: 1-DKA, type 1 ([place]) -Newly diagnosed diabetes -Hemoglobin A1c 12.0 -Normal TSH -Anion gap is close, CBGs mainly in low 200s range; CO2 around 11 -Continue IV insulin -Continue to follow CBGs and electrolytes -Remains n.p.o.; except for sips with medications and ice chips.  2-Metabolic acidosis -In the setting of DKA process -Continue treatment as mentioned above.  3-hypokalemia -In the setting of dehydration and ongoing insulin therapy with DKA process  -Follow electrolytes and further replete as needed. -Magnesium within normal limits.  4-AKI (acute kidney injury) ([place]) -Appears to be prerenal in nature -Continue fluid resuscitation and follow renal function trend. -No dysuria or signs of infection UA.  5-Tachycardia -In the setting of dehydration and DKA -Continue IV fluids.  6-Dehydration with hyponatremia -In the setting of profound dehydration from uncontrolled hyperglycemia/DKA -Continue IV fluids; IV fluid has been changed to D5 normal saline to assist with sodium repletion at the same time.  DVT prophylaxis: Heparin Code Status: Full code Family Communication: No family at bedside. Disposition Plan: Remains inpatient and also in stepdown; continue insulin drip, continue to follow electrolytes and further replete as needed, continue IV fluids.  Consultants:   None  Procedures:   None  Antimicrobials:  Anti-infectives (From admission, onward)   None      Subjective: Afebrile, no chest pain, no nausea, no vomiting.  Reports dry mouth, still requiring insulin drip due to ongoing metabolic acidosis and feeling generally weak.  Objective: Vitals:   02/18/19 1200 02/18/19 1300 02/18/19 1400 02/18/19 1500  BP: 108/79 106/70 (!) 118/93 100/66  Pulse: 92 86 (!) 103 84  Resp: (!) 23 13 (!) 25 17  Temp:      TempSrc:      SpO2: 99% 98% 99% 98%  Weight:      Height:        Intake/Output Summary (Last 24 hours) at 02/18/2019 1606 Last data filed at 02/18/2019 1543 Gross per 24 hour  Intake 2351.57 ml  Output 2725 ml  Net -373.43 ml   Filed Weights   02/17/19  1245 02/17/19 1607  Weight: 61.7 kg 60.5 kg    Examination: General exam: Alert, awake, oriented x 3, still feeling weak and demonstrating positive tachypnea.  Requiring insulin drip with ongoing metabolic acidosis process. Respiratory system: Clear to auscultation.  Positive tachypnea.  No wheezing, good oxygen saturation on room air.  No using  accessory muscles. Cardiovascular system:RRR. No murmurs, rubs, gallops. Gastrointestinal system: Abdomen is nondistended, soft and nontender. No organomegaly or masses felt. Normal bowel sounds heard. Central nervous system: Alert and oriented. No focal neurological deficits. Extremities: No C/C/E, +pedal pulses Skin: No rashes, lesions or ulcers Psychiatry: Judgement and insight appear normal. Mood & affect appropriate.     Data Reviewed: I have personally reviewed following labs and imaging studies  CBC: Recent Labs  Lab 02/17/19 0828  WBC 10.0  HGB 14.3  HCT 41.0  MCV 91.9  PLT 152   Basic Metabolic Panel: Recent Labs  Lab 02/17/19 0742 02/17/19 0828  02/17/19 1944 02/17/19 2331 02/18/19 0410 02/18/19 1058 02/18/19 1448  NA 131*  --    < > 129* 129* 126* 124* 121*  K 3.7  --    < > 3.0* 2.9* 2.7* 3.7 3.7  CL 104  --    < > 109 109 106 102 101  CO2 5*  --    < > 9* 11* 11* 13* 13*  GLUCOSE 383*  --    < > 180* 133* 145* 163* 184*  BUN 20  --    < > 22* 25* 24* 19 20  CREATININE 1.45*  --    < > 1.20 1.28* 1.31* 1.31* 1.31*  CALCIUM 8.2*  --    < > 8.7* 8.8* 8.8* 8.9 9.2  MG 2.1  --   --   --   --   --   --   --   PHOS  --  3.2  --   --   --   --   --   --    < > = values in this interval not displayed.   GFR: Estimated Creatinine Clearance: 72.5 mL/min (A) (by C-G formula based on SCr of 1.31 mg/dL (H)).   Liver Function Tests: Recent Labs  Lab 02/17/19 0742  AST 21  ALT 17  ALKPHOS 122  BILITOT 1.6*  PROT 6.7  ALBUMIN 3.7   HbA1C: Recent Labs    02/17/19 0828  HGBA1C 12.0*   CBG: Recent Labs  Lab 02/18/19 1057 02/18/19 1146 02/18/19 1245 02/18/19 1346 02/18/19 1505  GLUCAP 146* 184* 198* 177* 203*   Thyroid Function Tests: Recent Labs    02/17/19 0828  TSH 1.370   Urine analysis:    Component Value Date/Time   COLORURINE STRAW (A) 02/17/2019 0721   APPEARANCEUR CLEAR 02/17/2019 0721   LABSPEC 1.021 02/17/2019 0721   PHURINE 5.0  02/17/2019 0721   GLUCOSEU >=500 (A) 02/17/2019 0721   HGBUR MODERATE (A) 02/17/2019 0721   BILIRUBINUR NEGATIVE 02/17/2019 0721   KETONESUR 80 (A) 02/17/2019 0721   PROTEINUR 100 (A) 02/17/2019 0721   NITRITE NEGATIVE 02/17/2019 0721   LEUKOCYTESUR NEGATIVE 02/17/2019 0721    Recent Results (from the past 240 hour(s))  Blood culture (routine x 2)     Status: None (Preliminary result)   Collection Time: 02/17/19  8:18 AM   Specimen: BLOOD  Result Value Ref Range Status   Specimen Description BLOOD LEFT ARM  Final   Special Requests   Final    BOTTLES DRAWN AEROBIC  AND ANAEROBIC Blood Culture adequate volume   Culture   Final    NO GROWTH < 24 HOURS Performed at [facility], [address] [place], [place] [address]    Report Status PENDING  Incomplete  Blood culture (routine x 2)     Status: None (Preliminary result)   Collection Time: 02/17/19  8:28 AM   Specimen: BLOOD  Result Value Ref Range Status   Specimen Description BLOOD LEFT HAND  Final   Special Requests   Final    BOTTLES DRAWN AEROBIC ONLY Blood Culture results may not be optimal due to an inadequate volume of blood received in culture bottles   Culture   Final    NO GROWTH < 24 HOURS Performed at [facility], [address] [place], [place] [address]    Report Status PENDING  Incomplete  SARS CORONAVIRUS 2 (TAT 6-24 HRS) Nasopharyngeal Nasopharyngeal Swab     Status: None   Collection Time: 02/17/19  9:21 AM   Specimen: Nasopharyngeal Swab  Result Value Ref Range Status   SARS Coronavirus 2 NEGATIVE NEGATIVE Final    Comment: (NOTE) SARS-CoV-2 target nucleic acids are NOT DETECTED. The SARS-CoV-2 RNA is generally detectable in upper and lower respiratory specimens during the acute phase of infection. Negative results do not preclude SARS-CoV-2 infection, do not rule out co-infections with other pathogens, and should not be used as the sole basis for treatment or other patient management decisions.  Negative results must be combined with clinical observations, patient history, and epidemiological information. The expected result is Negative. Fact Sheet for Patients: HairSlick.nohttps://www.fda.gov/media/138098/download Fact Sheet for Healthcare Providers: quierodirigir.comhttps://www.fda.gov/media/138095/download This test is not yet approved or cleared by the [place] FDA and  has been authorized for detection and/or diagnosis of SARS-CoV-2 by FDA under an Emergency Use Authorization (EUA). This EUA will remain  in effect (meaning this test can be used) for the duration of the COVID-19 declaration under Section 56 4(b)(1) of the Act, 21 U.S.C. section 360bbb-3(b)(1), unless the authorization is terminated or revoked sooner. Performed at [facility], [address] [place], [place] [address]   MRSA PCR Screening     Status: None   Collection Time: 02/17/19  3:49 PM   Specimen: Nasal Mucosa; Nasopharyngeal  Result Value Ref Range Status   MRSA by PCR NEGATIVE NEGATIVE Final    Comment:        The GeneXpert MRSA Assay (FDA approved for NASAL specimens only), is one component of a comprehensive MRSA colonization surveillance program. It is not intended to diagnose MRSA infection nor to guide or monitor treatment for MRSA infections. Performed at [facility], [address] [place], [place] [address]      Scheduled Meds: . Chlorhexidine Gluconate Cloth  6 each Topical Daily  . heparin  5,000 Units Subcutaneous Q8H  . pantoprazole  40 mg Oral Daily   Continuous Infusions: . sodium chloride    . dextrose 5 % and 0.9% NaCl    . insulin 4.4 mL/hr at 02/18/19 1543     LOS: 1 day    Time spent: 35 minutes. Greater than 50% of this time was spent in direct contact with the patient, coordinating care and discussing relevant ongoing clinical issues, including DKA, insulin-dependent diabetes and elevated A1c; patient received information about diet and not skipping meals pathway to  minimize episodes of hypoglycemia and provide proper nutrition.Vassie Loll.     Soren Lazarz, MD [facility] Pager [phone]  02/18/2019, 4:06 PM

## 2019-02-18 NOTE — Progress Notes (Addendum)
Spoke with patient on the phone briefly. He states that there are no blood relatives that he knows of with diabetes. States that he has been following intermittent fasting and drinking natural vegetable juices at home.  Talked with patient about his HgbA1C of 12% and the meaning of it. Patient was short of breath at the time.   I talked with his sister on the phone at length who had very good questions about his diabetes. Discussed type 1 diabetes, pathophysiology, A1C, normal blood sugar, DKA, need for insulin everyday. Patient does not have insurance at this time. May need to explore affordable insulins such as [employer] Relion NPH and Regular or 70/30 mix insulin pens. Will have care manager see patient. Recommended to sister the [place] home blood glucose meter, strips, and lancets. Living Well with Diabetes booklet ordered for patient.  Dietician to see patient. Recommended to sister that patient not follow intermittent fasting right now, especially on insulin. This would need to be explored with a physician. He could use some meal planning ideas to follow.   Staff RNs will have patient practice giving own injections when on SQ insulin and teach patient proper insulin administration and teach him how to check own blood sugars.  Will need information on hypoglycemia and treatment when insulin regimen is decided.   Will continue to monitor blood sugars while in the hospital.  Harvel Ricks RN BSN CDE Diabetes Coordinator Pager: [phone]  8am-5pm

## 2019-02-18 NOTE — Progress Notes (Signed)
[place] voucher for patient's prescriptions given to patient's sister, patient okay with that. Patient's sister verbalized understanding to not misplace it and to use it here in [place] or [place] prior to going to [place], once patient is discharged.

## 2019-02-18 NOTE — TOC Progression Note (Signed)
Transition of Care [facility]) - Progression Note    Patient Details  Name: Victor Parsons MRN: [id number] Date of Birth: October 17, 1990  Transition of Care [facility]) CM/SW Contact  Ihor Gully, LCSW Phone Number: 02/18/2019, 3:49 PM  Clinical Narrative:    [place] voucher prepared for patient. Placed on chart. Charge RN, Caryl Pina, advised of voucher placement. Sticky note on chart advising of MATCH voucher location and the need for it to be provided to patient once discharged.    Expected Discharge Plan: Home/Self Care Barriers to Discharge: Continued Medical Work up  Expected Discharge Plan and Services Expected Discharge Plan: Home/Self Care       Living arrangements for the past 2 months: Homeless                                       Social Determinants of Health (SDOH) Interventions    Readmission Risk Interventions No flowsheet data found.

## 2019-02-18 NOTE — TOC Initial Note (Signed)
Transition of Care [facility]) - Initial/Assessment Note    Patient Details  Name: Victor Parsons MRN: [id number] Date of Birth: 12-Sep-1990  Transition of Care [facility]) CM/SW Contact:    Annice Needy, LCSW Phone Number: 02/18/2019, 12:51 PM  Clinical Narrative:                 Patient homeless. Admitted for DKA. New diagnosis of DKA. Patient's sister, Victor Parsons, provided history. Patient lives in his car and has no stable home. Patient was previously in [facility] then moved to [place] and lived in a homestead situation. The homestead situation did not work out and he came to [facility] and was trying to move with her to [facility] when this onset of illness happened.  Patient is unemployed.  He has had psychiatrist services in the past but was never formally diagnosed with a mental health diagnosis. 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   LCSW discussed with patient the need for a MATCH voucher to provide 30 days of non controlled substances. Advised that each prescription would be $3 at participating pharmacies. Advised that MATCH list would have a list of local participating pharmacies. TOC team lead emailed to prepare MATCH in [facility] system for patient with expectation of weekend discharge.  TOC will follow through discharge and address as issues arise.    Expected Discharge Plan: Home/Self Care Barriers to Discharge: Continued Medical Work up   Patient Goals and CMS Choice Patient states their goals for this hospitalization and ongoing recovery are:: The goal is for patient to discharge and move to [place] with is sister.      Expected Discharge Plan and Services Expected Discharge Plan: Home/Self Care       Living arrangements for the past 2 months: Homeless                                      Prior Living Arrangements/Services Living arrangements for the past 2 months: Homeless Lives with:: Self Patient language and need for  interpreter reviewed:: Yes Do you feel safe going back to the place where you live?: Yes      Need for Family Participation in Patient Care: Yes (Comment) Care giver support system in place?: Yes (comment)   Criminal Activity/Legal Involvement Pertinent to Current Situation/Hospitalization: No - Comment as needed  Activities of Daily Living Home Assistive Devices/Equipment: None ADL Screening (condition at time of admission) Patient's cognitive ability adequate to safely complete daily activities?: No Is the patient deaf or have difficulty hearing?: No Does the patient have difficulty seeing, even when wearing glasses/contacts?: No Does the patient have difficulty concentrating, remembering, or making decisions?: No Patient able to express need for assistance with ADLs?: Yes Does the patient have difficulty dressing or bathing?: No Independently performs ADLs?: Yes (appropriate for developmental age) Does the patient have difficulty walking or climbing stairs?: No Weakness of Legs: None Weakness of Arms/Hands: None  Permission Sought/Granted Permission sought to share information with : Family Supports          Permission granted to share info w Relationship: Sister, Victor Parsons     Emotional Assessment Appearance:: Appears stated age   Affect (typically observed): Unable to Assess Orientation: : Oriented to Self, Oriented to Place, Oriented to  Time, Oriented to Situation Alcohol / Substance Use: Not Applicable Psych Involvement: No (comment)  Admission diagnosis:  AKI (acute kidney injury) ([place]) [N17.9] Diabetic ketoacidosis without coma associated with type 1 diabetes mellitus ([place]) [E10.10] Patient Active Problem List   Diagnosis Date Noted  . DKA, type 1 ([place]) 02/17/2019  . Metabolic acidosis 44/05/270  . AKI (acute kidney injury) ([place]) 02/17/2019  . Tachycardia 02/17/2019  . Dehydration with hyponatremia 02/17/2019   PCP:  Patient, No Pcp Per Pharmacy:    [place], [place] Parsons - [id number] [place] Victor Parsons [address] Phone: [phone] Fax: [phone]     Social Determinants of Health (SDOH) Interventions    Readmission Risk Interventions No flowsheet data found.

## 2019-02-18 NOTE — Progress Notes (Signed)
CRITICAL VALUE ALERT  Critical Value: K 2.7  Date & Time Notied:  10/16 0513  Provider Notified: Lisabeth Devoid  Orders Received/Actions taken: see chart

## 2019-02-18 NOTE — Plan of Care (Signed)
  RD consulted for nutrition education regarding diabetes.   Lab Results  Component Value Date   HGBA1C 12.0 (H) 02/17/2019  Patient with difficulty reporting nutrition history, long pauses in between fragmented sentences and repeatedly stated "I don't remember what I was talking about" Patient reports variety of eating patterns over the past year and stated that he was a vegan for a few months, has tried "pseudo keto diet" and more recently intermittent fasting and only raw foods (raw eggs, meat, vegetables, and juicing) Sister in room who interjected during patient report stating he is homeless and "has been eating rotten food." Patient recalls living in his car and due to lack of money, he has had to eat spoiled food, but insisted he felt the best he has ever felt following this diet prior to having this "adrenal insufficiency episode"  Discussed importance of controlled and consistent carbohydrate intake throughout the day and discouraged intermittent fasting; educated on the importance of glucose control. Provided examples of ways to balance meals/snacks and encouraged intake of high-fiber, whole grain complex carbohydrates.   RD provided "Carbohydrate Counting for People with Diabetes" handout from the Academy of Nutrition and Dietetics. Discussed different food groups and their effects on blood sugar, emphasizing carbohydrate-containing foods. Provided list of carbohydrates and recommended serving sizes of common foods as well as sample menu. Sister of patient very receptive to education handout, patient expresses limited interest in making dietary changes.  Expect poor compliance.  Body mass index is 20.28 kg/m. Pt meets criteria for WNL based on current BMI.  Current diet order is NPO. Labs and medications reviewed. No further nutrition interventions warranted at this time. RD contact information provided. If additional nutrition issues arise, please re-consult RD.  Lajuan Lines, [place],  [facility] Clinical Nutrition Office [phone] After Hours/Weekend Pager: [phone]

## 2019-02-19 ENCOUNTER — Inpatient Hospital Stay (HOSPITAL_COMMUNITY): Payer: Self-pay

## 2019-02-19 LAB — BASIC METABOLIC PANEL
Anion gap: 6 (ref 5–15)
Anion gap: 5 (ref 5–15)
Anion gap: 5 (ref 5–15)
Anion gap: 7 (ref 5–15)
Anion gap: 5 (ref 5–15)
Anion gap: 4 — ABNORMAL LOW (ref 5–15)
BUN: 14 mg/dL (ref 6–20)
BUN: 12 mg/dL (ref 6–20)
BUN: 11 mg/dL (ref 6–20)
BUN: 10 mg/dL (ref 6–20)
BUN: 10 mg/dL (ref 6–20)
BUN: 9 mg/dL (ref 6–20)
CO2: 15 mmol/L — ABNORMAL LOW (ref 22–32)
CO2: 17 mmol/L — ABNORMAL LOW (ref 22–32)
CO2: 18 mmol/L — ABNORMAL LOW (ref 22–32)
CO2: 18 mmol/L — ABNORMAL LOW (ref 22–32)
CO2: 18 mmol/L — ABNORMAL LOW (ref 22–32)
CO2: 18 mmol/L — ABNORMAL LOW (ref 22–32)
Calcium: 9.2 mg/dL (ref 8.9–10.3)
Calcium: 9 mg/dL (ref 8.9–10.3)
Calcium: 8.8 mg/dL — ABNORMAL LOW (ref 8.9–10.3)
Calcium: 9.3 mg/dL (ref 8.9–10.3)
Calcium: 8.8 mg/dL — ABNORMAL LOW (ref 8.9–10.3)
Calcium: 9 mg/dL (ref 8.9–10.3)
Chloride: 106 mmol/L (ref 98–111)
Chloride: 106 mmol/L (ref 98–111)
Chloride: 105 mmol/L (ref 98–111)
Chloride: 104 mmol/L (ref 98–111)
Chloride: 105 mmol/L (ref 98–111)
Chloride: 108 mmol/L (ref 98–111)
Creatinine, Ser: 1.22 mg/dL (ref 0.61–1.24)
Creatinine, Ser: 1.23 mg/dL (ref 0.61–1.24)
Creatinine, Ser: 1.2 mg/dL (ref 0.61–1.24)
Creatinine, Ser: 1.26 mg/dL — ABNORMAL HIGH (ref 0.61–1.24)
Creatinine, Ser: 1.23 mg/dL (ref 0.61–1.24)
Creatinine, Ser: 1.2 mg/dL (ref 0.61–1.24)
GFR calc Af Amer: >60 mL/min (ref >60)
GFR calc Af Amer: >60 mL/min (ref >60)
GFR calc Af Amer: >60 mL/min (ref >60)
GFR calc Af Amer: >60 mL/min (ref >60)
GFR calc Af Amer: >60 mL/min (ref >60)
GFR calc Af Amer: >60 mL/min (ref >60)
GFR calc non Af Amer: >60 mL/min (ref >60)
GFR calc non Af Amer: >60 mL/min (ref >60)
GFR calc non Af Amer: >60 mL/min (ref >60)
GFR calc non Af Amer: >60 mL/min (ref >60)
GFR calc non Af Amer: >60 mL/min (ref >60)
GFR calc non Af Amer: >60 mL/min (ref >60)
Glucose, Bld: 162 mg/dL — ABNORMAL HIGH (ref 70–99)
Glucose, Bld: 155 mg/dL — ABNORMAL HIGH (ref 70–99)
Glucose, Bld: 170 mg/dL — ABNORMAL HIGH (ref 70–99)
Glucose, Bld: 140 mg/dL — ABNORMAL HIGH (ref 70–99)
Glucose, Bld: 200 mg/dL — ABNORMAL HIGH (ref 70–99)
Glucose, Bld: 135 mg/dL — ABNORMAL HIGH (ref 70–99)
Potassium: 2.5 mmol/L — CL (ref 3.5–5.1)
Potassium: 2.6 mmol/L — CL (ref 3.5–5.1)
Potassium: 2.8 mmol/L — ABNORMAL LOW (ref 3.5–5.1)
Potassium: 2.7 mmol/L — CL (ref 3.5–5.1)
Potassium: 2.8 mmol/L — ABNORMAL LOW (ref 3.5–5.1)
Potassium: 2.7 mmol/L — CL (ref 3.5–5.1)
Sodium: 127 mmol/L — ABNORMAL LOW (ref 135–145)
Sodium: 128 mmol/L — ABNORMAL LOW (ref 135–145)
Sodium: 128 mmol/L — ABNORMAL LOW (ref 135–145)
Sodium: 129 mmol/L — ABNORMAL LOW (ref 135–145)
Sodium: 128 mmol/L — ABNORMAL LOW (ref 135–145)
Sodium: 130 mmol/L — ABNORMAL LOW (ref 135–145)

## 2019-02-19 LAB — GLUCOSE, CAPILLARY
Glucose-Capillary: 128 mg/dL — ABNORMAL HIGH (ref 70–99)
Glucose-Capillary: 134 mg/dL — ABNORMAL HIGH (ref 70–99)
Glucose-Capillary: 134 mg/dL — ABNORMAL HIGH (ref 70–99)
Glucose-Capillary: 140 mg/dL — ABNORMAL HIGH (ref 70–99)
Glucose-Capillary: 142 mg/dL — ABNORMAL HIGH (ref 70–99)
Glucose-Capillary: 147 mg/dL — ABNORMAL HIGH (ref 70–99)
Glucose-Capillary: 147 mg/dL — ABNORMAL HIGH (ref 70–99)
Glucose-Capillary: 148 mg/dL — ABNORMAL HIGH (ref 70–99)
Glucose-Capillary: 149 mg/dL — ABNORMAL HIGH (ref 70–99)
Glucose-Capillary: 153 mg/dL — ABNORMAL HIGH (ref 70–99)
Glucose-Capillary: 157 mg/dL — ABNORMAL HIGH (ref 70–99)
Glucose-Capillary: 161 mg/dL — ABNORMAL HIGH (ref 70–99)
Glucose-Capillary: 163 mg/dL — ABNORMAL HIGH (ref 70–99)
Glucose-Capillary: 165 mg/dL — ABNORMAL HIGH (ref 70–99)
Glucose-Capillary: 170 mg/dL — ABNORMAL HIGH (ref 70–99)
Glucose-Capillary: 173 mg/dL — ABNORMAL HIGH (ref 70–99)
Glucose-Capillary: 174 mg/dL — ABNORMAL HIGH (ref 70–99)
Glucose-Capillary: 174 mg/dL — ABNORMAL HIGH (ref 70–99)
Glucose-Capillary: 177 mg/dL — ABNORMAL HIGH (ref 70–99)
Glucose-Capillary: 179 mg/dL — ABNORMAL HIGH (ref 70–99)
Glucose-Capillary: 182 mg/dL — ABNORMAL HIGH (ref 70–99)
Glucose-Capillary: 191 mg/dL — ABNORMAL HIGH (ref 70–99)
Glucose-Capillary: 192 mg/dL — ABNORMAL HIGH (ref 70–99)

## 2019-02-19 LAB — MAGNESIUM: Magnesium: 1.9 mg/dL (ref 1.7–2.4)

## 2019-02-19 MED ORDER — METHOCARBAMOL 1000 MG/10ML IJ SOLN
500.0000 mg | Freq: Three times a day (TID) | INTRAVENOUS | Status: DC | PRN
  Administered 2019-02-19: 500 mg via INTRAVENOUS
  Filled 2019-02-19 (×2): qty 5

## 2019-02-19 MED ORDER — SALINE SPRAY 0.65 % NA SOLN
1.0000 | NASAL | Status: DC | PRN
  Filled 2019-02-19: qty 44

## 2019-02-19 MED ORDER — POTASSIUM CHLORIDE CRYS ER 20 MEQ PO TBCR
40.0000 meq | EXTENDED_RELEASE_TABLET | Freq: Two times a day (BID) | ORAL | Status: AC
  Administered 2019-02-19 (×2): 40 meq via ORAL
  Filled 2019-02-19 (×2): qty 2

## 2019-02-19 MED ORDER — METHOCARBAMOL 1000 MG/10ML IJ SOLN
INTRAMUSCULAR | Status: AC
  Filled 2019-02-19: qty 10

## 2019-02-19 MED ORDER — POTASSIUM CHLORIDE CRYS ER 20 MEQ PO TBCR
40.0000 meq | EXTENDED_RELEASE_TABLET | ORAL | Status: AC
  Administered 2019-02-19 (×2): 40 meq via ORAL
  Filled 2019-02-19 (×2): qty 2

## 2019-02-19 NOTE — Progress Notes (Signed)
PROGRESS NOTE    Victor Parsons  YQM:[id number] DOB: Nov 04, 1990 DOA: 02/17/2019 PCP: Patient, No Pcp Per     Brief Narrative:  28 y.o. male without past medical history; who presented to the emergency department secondary to not feeling well and recent elevated blood sugars.  Patient reports never been diagnosed with diabetes, he reports tachypnea, severe dry mouth, polyuria and polydipsia.  Patient reports having some headaches and not feeling well.  He denies chest pain, fever, chills, shortness of breath, nausea, vomiting, dysuria, hematuria, focal weakness or any other complaints.  Patient reports been in the process of traveling through town trying to get to his sister house in [place]; but felt significantly ill slightly confused, he called paramedics secondary to these ongoing conditions.  When EMS arrived they found patients blood sugar was found in the mid 300 range.  He was tachypneic and presented significant dry mouth.  Patient reports not taking the medications and express all his symptoms have started in the last 24 to 36 hours prior to admission.  Patient denies any alcohol, drug use, tobacco abuse or allergies to any medications. In the ED patient was found to be in DKA, CO2 was 5, blood sugar in the 380 range and anion gap of 26.  IV fluids and insulin drip was initiated.  TRH was contacted to admit patient for further evaluation and management of newly diagnosed diabetes with DKA presentation.    Assessment & Plan: 1-DKA, type 1 (HCC) -Newly diagnosed diabetes -Hemoglobin A1c 12.0 -Normal TSH -Anion gap is closed, CBGs mainly below 200s; CO2 around 17 -Continue IV insulin -Continue to follow CBGs and electrolytes -Remains n.p.o.; except for sips with medications and ice chips. -Will transition off insulin drip and start 70 failure and sliding scale when bicarb > 19.  2-Metabolic acidosis -In the setting of DKA process -Continue treatment as mentioned above.   3-hypokalemia -In the setting of dehydration and ongoing insulin therapy with DKA process -Follow electrolytes and further replete as needed. -Magnesium within normal limits.  4-AKI (acute kidney injury) (HCC) -Appears to be prerenal in nature -Continue fluid resuscitation and follow renal function trend. -Cr at 1.23 -No dysuria or signs of infection UA.  5-Tachycardia -In the setting of dehydration and DKA -Continue IV fluids. -HR is now stable.  6-Dehydration with hyponatremia -In the setting of profound dehydration from uncontrolled hyperglycemia/DKA -Continue IV fluids; D5/0.9 saline to assist with sodium repletion at the same time.  7-Fever -isolated episode of fever -no clear source of infection appreciated -use antipyretics as needed -continue monitoring for now -continue holding on antibiotics -will check chest x ray -follow blood culture (so far without growth). -urine analysis not suspicious for acute infection -Covid test was negative  DVT prophylaxis: Heparin Code Status: Full code Family Communication: None at bedside Disposition Plan: Remains inpatient and in stepdown. Continue insulin drip, continue to follow electrolytes and further replete as needed, continue IV fluids.   Consultants:   None  Procedures:   None  Antimicrobials:  Anti-infectives (From admission, onward)   None      Subjective: Patient had an isolated episode of fever (T max 102 F). Denies headache, chest pain, nausea and vomiting. Still reporting a little dry mouth, but says this is improving from yesterday. Still requiring insulin drip due to ongoing metabolic acidosis and general weakness.   Objective: Vitals:   02/19/19 0500 02/19/19 0600 02/19/19 0700 02/19/19 0800  BP: 100/73 111/69 106/70 99/65  Pulse: 80 86 86 86  Resp: 15 14 16 17   Temp:    (!) 102 F (38.9 C)  TempSrc:    Oral  SpO2: 98% 100% 99% 99%  Weight:  61.1 kg    Height:        Intake/Output Summary  (Last 24 hours) at 02/19/2019 1019 Last data filed at 02/19/2019 0847 Gross per 24 hour  Intake 2924.86 ml  Output 2550 ml  Net 374.86 ml   Filed Weights   02/17/19 0656 02/17/19 1607 02/19/19 0600  Weight: 61.7 kg 60.5 kg 61.1 kg    Examination: General exam: Alert, awake, oriented x 3. Reports feeling better. Still requiring insulin drip with ongoing metabolic acidosis process.  Respiratory system: Clear to auscultation. Respiratory effort normal. No wheezing, crackles or rales. Not using accessory muscles.  Cardiovascular system:RRR. No murmurs, rubs, gallops. Gastrointestinal system: Abdomen is nondistended, soft and nontender. No organomegaly or masses felt. Normal bowel sounds heard. Central nervous system: Alert and oriented. No focal neurological deficits. Extremities: No cyanosis, clubbing or edema. +pedal pulses Skin: No rashes, lesions or ulcers Psychiatry: Judgement and insight appear normal. Mood & affect appropriate.   Data Reviewed: I have personally reviewed following labs and imaging studies  CBC: Recent Labs  Lab 02/17/19 0828  WBC 10.0  HGB 14.3  HCT 41.0  MCV 91.9  PLT 152   Basic Metabolic Panel: Recent Labs  Lab 02/17/19 0742 02/17/19 0828  02/18/19 1058 02/18/19 1448 02/18/19 2100 02/19/19 0128 02/19/19 0611  NA 131*  --    < > 124* 121* 128* 127* 128*  K 3.7  --    < > 3.7 3.7 2.9* 2.5* 2.6*  CL 104  --    < > 102 101 104 106 106  CO2 5*  --    < > 13* 13* 15* 15* 17*  GLUCOSE 383*  --    < > 163* 184* 118* 162* 155*  BUN 20  --    < > 19 20 16 14 12   CREATININE 1.45*  --    < > 1.31* 1.31* 1.26* 1.22 1.23  CALCIUM 8.2*  --    < > 8.9 9.2 9.6 9.2 9.0  MG 2.1  --   --   --   --   --  1.9  --   PHOS  --  3.2  --   --   --   --   --   --    < > = values in this interval not displayed.   GFR: Estimated Creatinine Clearance: 78 mL/min (by C-G formula based on SCr of 1.23 mg/dL).   Liver Function Tests: Recent Labs  Lab 02/17/19 0742   AST 21  ALT 17  ALKPHOS 122  BILITOT 1.6*  PROT 6.7  ALBUMIN 3.7   HbA1C: Recent Labs    02/17/19 0828  HGBA1C 12.0*   CBG: Recent Labs  Lab 02/19/19 0544 02/19/19 0640 02/19/19 0733 02/19/19 0844 02/19/19 0938  GLUCAP 161* 157* 134* 147* 147*   Thyroid Function Tests: Recent Labs    02/17/19 0828  TSH 1.370   Urine analysis:    Component Value Date/Time   COLORURINE STRAW (A) 02/17/2019 0721   APPEARANCEUR CLEAR 02/17/2019 0721   LABSPEC 1.021 02/17/2019 0721   PHURINE 5.0 02/17/2019 0721   GLUCOSEU >=500 (A) 02/17/2019 0721   HGBUR MODERATE (A) 02/17/2019 0721   BILIRUBINUR NEGATIVE 02/17/2019 0721   KETONESUR 80 (A) 02/17/2019 0721   PROTEINUR 100 (A) 02/17/2019 [id number]  NITRITE NEGATIVE 02/17/2019 [place] 02/17/2019 3716    Recent Results (from the past 240 hour(s))  Urine Culture     Status: None   Collection Time: 02/17/19  7:21 AM   Specimen: Urine, Clean Catch  Result Value Ref Range Status   Specimen Description   Final    URINE, CLEAN CATCH Performed at [facility], [address], [place], [place] [address]    Special Requests   Final    NONE Performed at [facility], [address], [place], [place] [address]    Culture   Final    NO GROWTH Performed at [facility], [place] [address], [place], [place] [address]    Report Status 02/18/2019 FINAL  Final  Blood culture (routine x 2)     Status: None (Preliminary result)   Collection Time: 02/17/19  8:18 AM   Specimen: BLOOD  Result Value Ref Range Status   Specimen Description BLOOD LEFT ARM  Final   Special Requests   Final    BOTTLES DRAWN AEROBIC AND ANAEROBIC Blood Culture adequate volume   Culture   Final    NO GROWTH 2 DAYS Performed at [facility], [address], [place], [place] [address]    Report Status PENDING  Incomplete  Blood culture (routine x 2)     Status: None (Preliminary result)   Collection Time: 02/17/19  8:28 AM   Specimen: BLOOD   Result Value Ref Range Status   Specimen Description BLOOD LEFT HAND  Final   Special Requests   Final    BOTTLES DRAWN AEROBIC ONLY Blood Culture results may not be optimal due to an inadequate volume of blood received in culture bottles   Culture   Final    NO GROWTH 2 DAYS Performed at [facility], [address], [place], [place] [address]    Report Status PENDING  Incomplete  SARS CORONAVIRUS 2 (TAT 6-24 HRS) Nasopharyngeal Nasopharyngeal Swab     Status: None   Collection Time: 02/17/19  9:21 AM   Specimen: Nasopharyngeal Swab  Result Value Ref Range Status   SARS Coronavirus 2 NEGATIVE NEGATIVE Final    Comment: (NOTE) SARS-CoV-2 target nucleic acids are NOT DETECTED. The SARS-CoV-2 RNA is generally detectable in upper and lower respiratory specimens during the acute phase of infection. Negative results do not preclude SARS-CoV-2 infection, do not rule out co-infections with other pathogens, and should not be used as the sole basis for treatment or other patient management decisions. Negative results must be combined with clinical observations, patient history, and epidemiological information. The expected result is Negative. Fact Sheet for Patients: SugarRoll.be Fact Sheet for Healthcare Providers: https://www.woods-mathews.com/ This test is not yet approved or cleared by the [place] FDA and  has been authorized for detection and/or diagnosis of SARS-CoV-2 by FDA under an Emergency Use Authorization (EUA). This EUA will remain  in effect (meaning this test can be used) for the duration of the COVID-19 declaration under Section 56 4(b)(1) of the Act, 21 U.S.C. section 360bbb-3(b)(1), unless the authorization is terminated or revoked sooner. Performed at [facility], [place] [address] [place],  [address]   MRSA PCR Screening     Status: None   Collection Time: 02/17/19  3:49 PM   Specimen: Nasal Mucosa;  Nasopharyngeal  Result Value Ref Range Status   MRSA by PCR NEGATIVE NEGATIVE Final    Comment:        The GeneXpert MRSA Assay (  FDA approved for NASAL specimens only), is one component of a comprehensive MRSA colonization surveillance program. It is not intended to diagnose MRSA infection nor to guide or monitor treatment for MRSA infections. Performed at [facility], [address] [place], [place] [address]      Scheduled Meds: . Chlorhexidine Gluconate Cloth  6 each Topical Daily  . heparin  5,000 Units Subcutaneous Q8H  . pantoprazole  40 mg Oral Daily  . potassium chloride  40 mEq Oral Q4H   Continuous Infusions: . sodium chloride    . dextrose 5 % and 0.9% NaCl 100 mL/hr at 02/19/19 0600  . insulin 1.8 Units/hr (02/19/19 0847)     LOS: 2 days    Time spent: 35 minutes. Greater than 50% of this time was spent in direct contact with the patient, coordinating care and discussing relevant ongoing clinical issues, including DKA, insulin-dependent diabetes and elevated A1c; patient received information about diet and not skipping meals pathway to minimize episodes of hypoglycemia and provide proper nutrition.Vassie Loll, MD [facility] Pager [phone]  02/19/2019, 10:19 AM

## 2019-02-19 NOTE — Progress Notes (Signed)
CRITICAL VALUE ALERT  Critical Value:  k 2.6  Date & Time Notied:  0745, 02/19/2019  Provider Notified: [place] aware  Orders Received/Actions taken: orders received

## 2019-02-19 NOTE — Progress Notes (Signed)
CRITICAL VALUE ALERT  Critical Value: K 2.7 Date & Time Notified: 02/19/19 2340  Provider Notified: [place]  Orders Received/Actions taken: Awaiting orders at this time.

## 2019-02-19 NOTE — Progress Notes (Signed)
CRITICAL VALUE ALERT  Critical Value:  k 2.6  Date & Time Notied:  02/19/2019 1530  Provider Notified: Barton Dubois  Orders Received/Actions taken: orders

## 2019-02-20 DIAGNOSIS — E876 Hypokalemia: Secondary | ICD-10-CM

## 2019-02-20 LAB — BASIC METABOLIC PANEL
Anion gap: 7 (ref 5–15)
Anion gap: 5 (ref 5–15)
Anion gap: 6 (ref 5–15)
BUN: 9 mg/dL (ref 6–20)
BUN: 7 mg/dL (ref 6–20)
BUN: 8 mg/dL (ref 6–20)
CO2: 18 mmol/L — ABNORMAL LOW (ref 22–32)
CO2: 19 mmol/L — ABNORMAL LOW (ref 22–32)
CO2: 21 mmol/L — ABNORMAL LOW (ref 22–32)
Calcium: 9 mg/dL (ref 8.9–10.3)
Calcium: 8.6 mg/dL — ABNORMAL LOW (ref 8.9–10.3)
Calcium: 8.9 mg/dL (ref 8.9–10.3)
Chloride: 107 mmol/L (ref 98–111)
Chloride: 107 mmol/L (ref 98–111)
Chloride: 103 mmol/L (ref 98–111)
Creatinine, Ser: 1.21 mg/dL (ref 0.61–1.24)
Creatinine, Ser: 1.17 mg/dL (ref 0.61–1.24)
Creatinine, Ser: 1.22 mg/dL (ref 0.61–1.24)
GFR calc Af Amer: >60 mL/min (ref >60)
GFR calc Af Amer: >60 mL/min (ref >60)
GFR calc Af Amer: >60 mL/min (ref >60)
GFR calc non Af Amer: >60 mL/min (ref >60)
GFR calc non Af Amer: >60 mL/min (ref >60)
GFR calc non Af Amer: >60 mL/min (ref >60)
Glucose, Bld: 164 mg/dL — ABNORMAL HIGH (ref 70–99)
Glucose, Bld: 180 mg/dL — ABNORMAL HIGH (ref 70–99)
Glucose, Bld: 223 mg/dL — ABNORMAL HIGH (ref 70–99)
Potassium: 2.8 mmol/L — ABNORMAL LOW (ref 3.5–5.1)
Potassium: 2.8 mmol/L — ABNORMAL LOW (ref 3.5–5.1)
Potassium: 3.2 mmol/L — ABNORMAL LOW (ref 3.5–5.1)
Sodium: 132 mmol/L — ABNORMAL LOW (ref 135–145)
Sodium: 131 mmol/L — ABNORMAL LOW (ref 135–145)
Sodium: 130 mmol/L — ABNORMAL LOW (ref 135–145)

## 2019-02-20 LAB — GLUCOSE, CAPILLARY
Glucose-Capillary: 144 mg/dL — ABNORMAL HIGH (ref 70–99)
Glucose-Capillary: 146 mg/dL — ABNORMAL HIGH (ref 70–99)
Glucose-Capillary: 161 mg/dL — ABNORMAL HIGH (ref 70–99)
Glucose-Capillary: 162 mg/dL — ABNORMAL HIGH (ref 70–99)
Glucose-Capillary: 163 mg/dL — ABNORMAL HIGH (ref 70–99)
Glucose-Capillary: 168 mg/dL — ABNORMAL HIGH (ref 70–99)
Glucose-Capillary: 177 mg/dL — ABNORMAL HIGH (ref 70–99)
Glucose-Capillary: 185 mg/dL — ABNORMAL HIGH (ref 70–99)
Glucose-Capillary: 185 mg/dL — ABNORMAL HIGH (ref 70–99)
Glucose-Capillary: 189 mg/dL — ABNORMAL HIGH (ref 70–99)
Glucose-Capillary: 190 mg/dL — ABNORMAL HIGH (ref 70–99)
Glucose-Capillary: 210 mg/dL — ABNORMAL HIGH (ref 70–99)
Glucose-Capillary: 213 mg/dL — ABNORMAL HIGH (ref 70–99)

## 2019-02-20 MED ORDER — INSULIN DETEMIR 100 UNIT/ML ~~LOC~~ SOLN
12.0000 [IU] | Freq: Two times a day (BID) | SUBCUTANEOUS | Status: DC
  Administered 2019-02-20 – 2019-02-21 (×3): 12 [IU] via SUBCUTANEOUS
  Filled 2019-02-20: qty 1
  Filled 2019-02-20 (×5): qty 0.12

## 2019-02-20 MED ORDER — SODIUM BICARBONATE 650 MG PO TABS
1300.0000 mg | ORAL_TABLET | Freq: Two times a day (BID) | ORAL | Status: AC
  Administered 2019-02-20 (×2): 1300 mg via ORAL
  Filled 2019-02-20 (×2): qty 2

## 2019-02-20 MED ORDER — INSULIN ASPART 100 UNIT/ML ~~LOC~~ SOLN
0.0000 [IU] | Freq: Three times a day (TID) | SUBCUTANEOUS | Status: DC
  Administered 2019-02-20: 3 [IU] via SUBCUTANEOUS
  Administered 2019-02-20: 2 [IU] via SUBCUTANEOUS
  Administered 2019-02-21: 3 [IU] via SUBCUTANEOUS

## 2019-02-20 MED ORDER — SODIUM CHLORIDE 0.9 % IV SOLN
INTRAVENOUS | Status: DC
  Administered 2019-02-20 – 2019-02-21 (×2): via INTRAVENOUS

## 2019-02-20 MED ORDER — POTASSIUM CHLORIDE 10 MEQ/100ML IV SOLN: 10.0000 meq | INTRAVENOUS | Status: DC

## 2019-02-20 MED ORDER — PHENOL 1.4 % MT LIQD
1.0000 | OROMUCOSAL | Status: DC | PRN
  Filled 2019-02-20: qty 177

## 2019-02-20 MED ORDER — POTASSIUM CHLORIDE CRYS ER 20 MEQ PO TBCR
40.0000 meq | EXTENDED_RELEASE_TABLET | Freq: Two times a day (BID) | ORAL | Status: DC
  Administered 2019-02-20 (×3): 40 meq via ORAL
  Filled 2019-02-20 (×3): qty 2

## 2019-02-20 NOTE — Progress Notes (Addendum)
PT resistant to taking heparin sq for DVT prophylaxis. RN answered questions, discussed risk of DVT and pt still refusing. Pt agreed to talk with pharmacy . Pharmacist to call pt. Victor Parsons  Patient now agrees to take heparin. SQ heparin given.   Md Philomena Doheny has ordered doppler for left lower extremity as pt now complaining his left leg and foot are tight (was c/o spasms yesterday), and cant bear weight on left foot. Pedal pulse moderate. Victor Parsons

## 2019-02-20 NOTE — Progress Notes (Addendum)
PROGRESS NOTE    Victor Parsons  EZM:[id number] DOB: 29-Sep-1990 DOA: 02/17/2019 PCP: Patient, No Pcp Per     Brief Narrative:  28 y.o. male without past medical history; who presented to the emergency department secondary to not feeling well and recent elevated blood sugars.  Patient reports never been diagnosed with diabetes, he reports tachypnea, severe dry mouth, polyuria and polydipsia.  Patient reports having some headaches and not feeling well.  He denies chest pain, fever, chills, shortness of breath, nausea, vomiting, dysuria, hematuria, focal weakness or any other complaints.  Patient reports been in the process of traveling through town trying to get to his sister house in [place]; but felt significantly ill slightly confused, he called paramedics secondary to these ongoing conditions.  When EMS arrived they found patients blood sugar was found in the mid 300 range.  He was tachypneic and presented significant dry mouth.  Patient reports not taking the medications and express all his symptoms have started in the last 24 to 36 hours prior to admission.  Patient denies any alcohol, drug use, tobacco abuse or allergies to any medications. In the ED patient was found to be in DKA, CO2 was 5, blood sugar in the 380 range and anion gap of 26.  IV fluids and insulin drip was initiated.  TRH was contacted to admit patient for further evaluation and management of newly diagnosed diabetes with DKA presentation.   Assessment & Plan: 1-DKA, type 1 (HCC) -Newly diagnosed diabetes -Hemoglobin A1c 12.0 -Normal TSH -Anion gap is closed, CBGs mainly below 180-200s; CO2 around 21 now -Will transition off insulin drip, start sliding scale insulin and Levemir. -Follow CBGs fluctuation and further adjust treatment as needed. -Continue adequate hydration and advance diet.  2-Metabolic acidosis -In the setting of DKA process -Continue treatment as mentioned above.  3-hypokalemia -In the setting of  dehydration and ongoing insulin therapy with DKA process -K is 3.2 currently -Magnesium within normal limits. -Continue repletion as needed -Continue monitoring on telemetry.  4-AKI (acute kidney injury) (HCC) -Appears to be prerenal in nature -Continue fluid resuscitation and follow renal function trend. -Cr at 1.17 -No dysuria or signs of infection UA. -Continue hydration.  5-Tachycardia -In the setting of dehydration and DKA -Continue monitoring on tele for now (given low K). -HR is now stable.  6-Dehydration with hyponatremia -In the setting of profound dehydration from uncontrolled hyperglycemia and DKA -Improved -Diet will be advanced -Follow electrolytes trend.  7-Fever -isolated episode of fever -no clear source of infection appreciated -Continue to use antipyretics as needed -Chest x-ray without acute cardiopulmonary process. -Urinalysis no suggesting infection, urine culture and blood culture without any growth. -Covid test was negative -Continue holding antibiotics.  DVT prophylaxis: Heparin ordered since admission; patient refusing it. Code Status: Full code Family Communication: None at bedside Disposition Plan: Remains inpatient. Continue to follow CBG's, transition him off insulin drip,advance diet and follow electrolytes trend.   Consultants:   None  Procedures:   None  Antimicrobials:  Anti-infectives (From admission, onward)   None      Subjective: Afebrile, denies chest pain, no shortness of breath, no nausea, no vomiting.  Patient overall feeling better with improvement in his sugar level and CO2 levels.  Objective: Vitals:   02/20/19 0741 02/20/19 0750 02/20/19 0757 02/20/19 0800  BP:   (!) 103/59 96/62  Pulse:   79 80  Resp:      Temp: (!) 100.5 F (38.1 C) (!) 100.5 F (38.1 C)  TempSrc: Axillary Axillary    SpO2:   99% 99%  Weight:      Height:        Intake/Output Summary (Last 24 hours) at 02/20/2019 1215 Last data  filed at 02/20/2019 0500 Gross per 24 hour  Intake 2640.53 ml  Output -  Net 2640.53 ml   Filed Weights   02/17/19 0656 02/17/19 1607 02/19/19 0600  Weight: 61.7 kg 60.5 kg 61.1 kg    Examination: General exam: Alert, awake, oriented x 3; no chest pain, no shortness of breath, no nausea, no vomiting.  Overall feeling much better and currently afebrile. Respiratory system: Clear to auscultation. Respiratory effort normal. Cardiovascular system:RRR. No murmurs, rubs, gallops. Gastrointestinal system: Abdomen is nondistended, soft and nontender. No organomegaly or masses felt. Normal bowel sounds heard. Central nervous system: Alert and oriented. No focal neurological deficits. Extremities: No C/C/E, +pedal pulses Skin: No rashes, lesions or ulcers Psychiatry: Judgement and insight appear normal. Mood & affect appropriate.    Data Reviewed: I have personally reviewed following labs and imaging studies  CBC: Recent Labs  Lab 02/17/19 0828  WBC 10.0  HGB 14.3  HCT 41.0  MCV 91.9  PLT 152   Basic Metabolic Panel: Recent Labs  Lab 02/17/19 0742 02/17/19 0828  02/19/19 0128  02/19/19 1913 02/19/19 2224 02/20/19 0356 02/20/19 0832 02/20/19 1116  NA 131*  --    < > 127*   < > 128* 130* 132* 131* 130*  K 3.7  --    < > 2.5*   < > 2.8* 2.7* 2.8* 2.8* 3.2*  CL 104  --    < > 106   < > 105 108 107 107 103  CO2 5*  --    < > 15*   < > 18* 18* 18* 19* 21*  GLUCOSE 383*  --    < > 162*   < > 200* 135* 164* 180* 223*  BUN 20  --    < > 14   < > 10 9 9 7 8   CREATININE 1.45*  --    < > 1.22   < > 1.23 1.20 1.21 1.17 1.22  CALCIUM 8.2*  --    < > 9.2   < > 8.8* 9.0 9.0 8.6* 8.9  MG 2.1  --   --  1.9  --   --   --   --   --   --   PHOS  --  3.2  --   --   --   --   --   --   --   --    < > = values in this interval not displayed.   GFR: Estimated Creatinine Clearance: 78.6 mL/min (by C-G formula based on SCr of 1.22 mg/dL).   Liver Function Tests: Recent Labs  Lab 02/17/19  0742  AST 21  ALT 17  ALKPHOS 122  BILITOT 1.6*  PROT 6.7  ALBUMIN 3.7   HbA1C: 12.0  CBG: Recent Labs  Lab 02/20/19 0747 02/20/19 0859 02/20/19 0956 02/20/19 1100 02/20/19 1200  GLUCAP 185* 146* 177* 213* 210*   Urine analysis:    Component Value Date/Time   COLORURINE STRAW (A) 02/17/2019 0721   APPEARANCEUR CLEAR 02/17/2019 0721   LABSPEC 1.021 02/17/2019 0721   PHURINE 5.0 02/17/2019 0721   GLUCOSEU >=500 (A) 02/17/2019 0721   HGBUR MODERATE (A) 02/17/2019 0721   BILIRUBINUR NEGATIVE 02/17/2019 0721   KETONESUR 80 (A) 02/17/2019 0721   PROTEINUR  100 (A) 02/17/2019 0721   NITRITE NEGATIVE 02/17/2019 0721   LEUKOCYTESUR NEGATIVE 02/17/2019 9935    Recent Results (from the past 240 hour(s))  Urine Culture     Status: None   Collection Time: 02/17/19  7:21 AM   Specimen: Urine, Clean Catch  Result Value Ref Range Status   Specimen Description   Final    URINE, CLEAN CATCH Performed at [facility], [address], [place], [place] [address]    Special Requests   Final    NONE Performed at [facility], [address] [place], [place] [address]    Culture   Final    NO GROWTH Performed at [facility], [place] [address], [place], [place] [address]    Report Status 02/18/2019 FINAL  Final  Blood culture (routine x 2)     Status: None (Preliminary result)   Collection Time: 02/17/19  8:18 AM   Specimen: BLOOD  Result Value Ref Range Status   Specimen Description BLOOD LEFT ARM  Final   Special Requests   Final    BOTTLES DRAWN AEROBIC AND ANAEROBIC Blood Culture adequate volume   Culture   Final    NO GROWTH 2 DAYS Performed at [facility], [address], [place], [place] [address]    Report Status PENDING  Incomplete  Blood culture (routine x 2)     Status: None (Preliminary result)   Collection Time: 02/17/19  8:28 AM   Specimen: BLOOD  Result Value Ref Range Status   Specimen Description BLOOD LEFT HAND  Final   Special Requests   Final     BOTTLES DRAWN AEROBIC ONLY Blood Culture results may not be optimal due to an inadequate volume of blood received in culture bottles   Culture   Final    NO GROWTH 2 DAYS Performed at [facility], [address] [place], [place] [address]    Report Status PENDING  Incomplete  SARS CORONAVIRUS 2 (TAT 6-24 HRS) Nasopharyngeal Nasopharyngeal Swab     Status: None   Collection Time: 02/17/19  9:21 AM   Specimen: Nasopharyngeal Swab  Result Value Ref Range Status   SARS Coronavirus 2 NEGATIVE NEGATIVE Final    Comment: (NOTE) SARS-CoV-2 target nucleic acids are NOT DETECTED. The SARS-CoV-2 RNA is generally detectable in upper and lower respiratory specimens during the acute phase of infection. Negative results do not preclude SARS-CoV-2 infection, do not rule out co-infections with other pathogens, and should not be used as the sole basis for treatment or other patient management decisions. Negative results must be combined with clinical observations, patient history, and epidemiological information. The expected result is Negative. Fact Sheet for Patients: SugarRoll.be Fact Sheet for Healthcare Providers: https://www.woods-mathews.com/ This test is not yet approved or cleared by the [place] FDA and  has been authorized for detection and/or diagnosis of SARS-CoV-2 by FDA under an Emergency Use Authorization (EUA). This EUA will remain  in effect (meaning this test can be used) for the duration of the COVID-19 declaration under Section 56 4(b)(1) of the Act, 21 U.S.C. section 360bbb-3(b)(1), unless the authorization is terminated or revoked sooner. Performed at [facility], [place] [address], [place], [place] [address]   MRSA PCR Screening     Status: None   Collection Time: 02/17/19  3:49 PM   Specimen: Nasal Mucosa; Nasopharyngeal  Result Value Ref Range Status   MRSA by PCR NEGATIVE NEGATIVE Final    Comment:  The  GeneXpert MRSA Assay (FDA approved for NASAL specimens only), is one component of a comprehensive MRSA colonization surveillance program. It is not intended to diagnose MRSA infection nor to guide or monitor treatment for MRSA infections. Performed at [facility], [address] [place], [place] [address]      Scheduled Meds: . Chlorhexidine Gluconate Cloth  6 each Topical Daily  . heparin  5,000 Units Subcutaneous Q8H  . insulin aspart  0-15 Units Subcutaneous TID WC  . insulin detemir  12 Units Subcutaneous BID  . pantoprazole  40 mg Oral Daily  . potassium chloride  40 mEq Oral BID  . sodium bicarbonate  1,300 mg Oral BID   Continuous Infusions: . sodium chloride    . methocarbamol (ROBAXIN) IV Stopped (02/19/19 1725)     LOS: 3 days    Time spent: 30 minutes. Sister updated at bedside.    Vassie Loll, MD [facility] Pager [phone]  02/20/2019, 12:15 PM

## 2019-02-20 NOTE — Progress Notes (Signed)
Transferred to room 323, placed on tele and verified with CCMD.

## 2019-02-20 NOTE — Progress Notes (Signed)
Pt self administered levemir with direction of RN. See education on careplan. [name]

## 2019-02-20 NOTE — Plan of Care (Signed)
DIABETIC TEACHING ONGOING. Victor Parsons

## 2019-02-21 ENCOUNTER — Inpatient Hospital Stay (HOSPITAL_COMMUNITY): Payer: Self-pay

## 2019-02-21 DIAGNOSIS — E1010 Type 1 diabetes mellitus with ketoacidosis without coma: Secondary | ICD-10-CM

## 2019-02-21 DIAGNOSIS — E876 Hypokalemia: Secondary | ICD-10-CM

## 2019-02-21 DIAGNOSIS — R509 Fever, unspecified: Secondary | ICD-10-CM

## 2019-02-21 DIAGNOSIS — K219 Gastro-esophageal reflux disease without esophagitis: Secondary | ICD-10-CM

## 2019-02-21 LAB — BASIC METABOLIC PANEL
Anion gap: 6 (ref 5–15)
BUN: 9 mg/dL (ref 6–20)
CO2: 22 mmol/L (ref 22–32)
Calcium: 8.7 mg/dL — ABNORMAL LOW (ref 8.9–10.3)
Chloride: 106 mmol/L (ref 98–111)
Creatinine, Ser: 1.11 mg/dL (ref 0.61–1.24)
GFR calc Af Amer: >60 mL/min (ref >60)
GFR calc non Af Amer: >60 mL/min (ref >60)
Glucose, Bld: 110 mg/dL — ABNORMAL HIGH (ref 70–99)
Potassium: 3.1 mmol/L — ABNORMAL LOW (ref 3.5–5.1)
Sodium: 134 mmol/L — ABNORMAL LOW (ref 135–145)

## 2019-02-21 LAB — GLUCOSE, CAPILLARY
Glucose-Capillary: 115 mg/dL — ABNORMAL HIGH (ref 70–99)
Glucose-Capillary: 153 mg/dL — ABNORMAL HIGH (ref 70–99)

## 2019-02-21 MED ORDER — ACETAMINOPHEN 325 MG PO TABS: 650.0000 mg | ORAL_TABLET | Freq: Four times a day (QID) | ORAL | 0 refills | Status: AC | PRN

## 2019-02-21 MED ORDER — PANTOPRAZOLE SODIUM 40 MG PO TBEC: 40.0000 mg | DELAYED_RELEASE_TABLET | Freq: Every day | ORAL | 1 refills | Status: AC

## 2019-02-21 MED ORDER — BLOOD GLUCOSE MONITOR KIT: PACK | 0 refills | Status: AC

## 2019-02-21 MED ORDER — POTASSIUM CHLORIDE CRYS ER 20 MEQ PO TBCR: 40.0000 meq | EXTENDED_RELEASE_TABLET | Freq: Every day | ORAL | 0 refills | Status: AC

## 2019-02-21 MED ORDER — RELION PEN NEEDLES 31G X 6 MM MISC: 2 refills | Status: AC

## 2019-02-21 MED ORDER — INSULIN ASP PROT & ASP FLEXPEN (70-30) 100 UNIT/ML ~~LOC~~ SUPN: 15.0000 [IU] | PEN_INJECTOR | Freq: Two times a day (BID) | SUBCUTANEOUS | 2 refills | Status: AC

## 2019-02-21 MED FILL — NOVOLOG MIX 70-30 FLEXPEN S: (70-30) 100 | 10 days supply | Qty: 3 | Fill #0

## 2019-02-21 NOTE — Progress Notes (Signed)
Patient and family state understanding of discharge instructions, prescriptions given. 

## 2019-02-21 NOTE — Discharge Summary (Signed)
Physician Discharge Summary  Victor Parsons NAT:[id number] DOB: 1990-05-16 DOA: 02/17/2019  PCP: Patient, No Pcp Per  Admit date: 02/17/2019 Discharge date: 02/21/2019  Time spent: 35 minutes  Recommendations for Outpatient Follow-up:  Repeat basic metabolic panel to follow renal function and electrolytes. Close follow-up to patient's CBGs and A1c with further adjustment to hypoglycemia regimen as required.  Discharge Diagnoses:  Principal Problem:   DKA, type 1 ([place]) Active Problems:   Metabolic acidosis   AKI (acute kidney injury) ([place])   Tachycardia   Dehydration with hyponatremia   Diabetic ketoacidosis without coma associated with type 1 diabetes mellitus ([place])   Fever   Hypokalemia   Gastroesophageal reflux disease   Discharge Condition: Stable and improved.  Patient discharged home with instruction to establish care with PCP and an endocrinologist.  Diet recommendation: Modified carbohydrate diet.  Filed Weights   02/17/19 0656 02/17/19 1607 02/19/19 0600  Weight: 61.7 kg 60.5 kg 61.1 kg    History of present illness:  27 y.o.malewithout past medical history;who presented to the emergency department secondary to not feeling well and recent elevated blood sugars. Patient reports never been diagnosed with diabetes,he reports tachypnea, severe dry mouth, polyuria and polydipsia. Patient reports having some headaches and not feeling well. He denies chest pain, fever, chills, shortness of breath, nausea, vomiting, dysuria, hematuria, focal weakness or any other complaints. Patient reports been in the process of traveling through town trying to get to his sister house in [place];but felt significantly ill slightly confused, he called paramedics secondary to these ongoing conditions. When EMS arrived they found patients blood sugar was found in the mid 300 range.He was tachypneic and presented significant dry mouth. Patient reports not taking the medications and  express all his symptoms have started in the last 24 to 36 hours prior to admission. Patient denies any alcohol, drug use, tobacco abuse or allergies to any medications. In the ED patient was found to be in DKA, CO2 was 5, blood sugar in the 380 range and anion gap of 26. IV fluids and insulin drip was initiated. [facility] was contacted to admit patient for further evaluation and management of newly diagnosed diabetes with DKA presentation.  Hospital Course:  1-DKA, type 1 ([place]) -Newly diagnosed diabetes -Hemoglobin A1c 12.0 -Normal TSH -Anion gap closed, CBGs mainly below 160's; CO2 around 22 now -Patient was adequately transition off insulin drip and at discharge will use 7580 15 units twice a day. -Patient has been instructed to establish care with PCP and endocrinology services at time of discharge to further manage his condition. -Prescription for a glucometer and supplies has been given to check his sugar 3 times a day to further assess at time of medication adjustment. -Patient educated about modified carbohydrate diet.  2-Metabolic acidosis -In the setting of DKA process -Continue treatment as mentioned above.  3-hypokalemia -In the setting of dehydration and ongoing insulin therapy with DKA process -K is 3.1-3.2 at discharge -Patient sent home with prescription for oral potassium supplementation.  4-AKI (acute kidney injury) ([place]) -Appears to be prerenal in nature -Creatinine within normal limits at discharge. -No dysuria or signs of infection on UA. -Continue maintaining adequate oral hydration.  5-Tachycardia -In the setting of dehydration and DKA -HR is now stable.  6-Dehydration with hyponatremia -In the setting of profound dehydration from uncontrolled hyperglycemia and DKA -Improved/resolved at time of discharge. -Patient advised to maintain adequate hydration. -Repeat raise metabolic panel will be sent to follow electrolytes trend.  7-Fever -isolated episode   -  no clear source of infection appreciated -Continue to use antipyretics as needed -Chest x-ray without acute cardiopulmonary process. -Urinalysis no suggesting infection, urine culture and blood culture without any growth so far. -Covid test was negative on admission. -No antibiotics provided.  8-GERD -Continue PPI.  Procedures:  See below for x-ray reports.  Consultations:  None  Discharge Exam: Vitals:   02/21/19 0402 02/21/19 0800  BP: 104/66 107/60  Pulse: 91 94  Resp: 20 19  Temp: 100.1 F (37.8 C) 99.1 F (37.3 C)  SpO2: 98% 97%    General: Afebrile, feeling great, denies chest pain, no shortness of breath, no nausea, no vomiting.  Tolerating diet demonstrated being able to inject insulin on his own.  Reports significant improvement no active major discomfort on his left leg. Cardiovascular: S1-S2, no rubs, no gallops, no murmurs. Respiratory: Clear to auscultation bilaterally; normal respiratory effort. Abdomen: Soft, nontender, nondistended, positive bowel sounds Extremities: No swelling appreciated, no tenderness on palpation, good pulses bilaterally.  No cyanosis or clubbing.  Discharge Instructions   Discharge Instructions    Diet Carb Modified   Complete by: As directed    Discharge instructions   Complete by: As directed    Take medications as prescribed Follow modified to 130 Keep use of well-hydrated Arrange follow-up to establish care with PCP and with endocrinologist. Make sure not to skip any meals and take insulin therapy as instructed.     Allergies as of 02/21/2019   No Known Allergies     Medication List    TAKE these medications   acetaminophen 325 MG tablet Commonly known as: TYLENOL Take 2 tablets (650 mg total) by mouth every 6 (six) hours as needed for mild pain, fever or headache.   blood glucose meter kit and supplies Kit Dispense based on patient and insurance preference. Use to check sugar levels three times a day  (fasting, current lunchtime and before going to bed). (FOR ICD-9 250.00, 250.01).   Insulin Asp Prot & Asp FlexPen (70-30) 100 UNIT/ML Supn Inject 15 Units into the skin 2 (two) times daily.   pantoprazole 40 MG tablet Commonly known as: PROTONIX Take 1 tablet (40 mg total) by mouth daily. Start taking on: February 22, 2019   potassium chloride SA 20 MEQ tablet Commonly known as: KLOR-CON Take 2 tablets (40 mEq total) by mouth daily.   ReliOn Pen Needles 31G X 6 MM Misc Generic drug: Insulin Pen Needle Use one pen to inject insulin twice a day as instructed.      No Known Allergies    The results of significant diagnostics from this hospitalization (including imaging, microbiology, ancillary and laboratory) are listed below for reference.    Significant Diagnostic Studies: Dg Chest 2 View  Result Date: 02/19/2019 CLINICAL DATA:  28 year old male with history of fever. EXAM: CHEST - 2 VIEW COMPARISON:  No priors. FINDINGS: Lung volumes are normal. No consolidative airspace disease. No pleural effusions. No pneumothorax. No pulmonary nodule or mass noted. Pulmonary vasculature and the cardiomediastinal silhouette are within normal limits. IMPRESSION: No radiographic evidence of acute cardiopulmonary disease. Electronically Signed   By: Vinnie Langton M.D.   On: 02/19/2019 11:31   US Venous Img Lower Unilateral Left  Result Date: 02/21/2019 CLINICAL DATA:  Lower extremity pain. EXAM: LEFT LOWER EXTREMITY VENOUS DOPPLER ULTRASOUND TECHNIQUE: Gray-scale sonography with graded compression, as well as color Doppler and duplex ultrasound were performed to evaluate the lower extremity deep venous systems from the level of the common femoral vein  and including the common femoral, femoral, profunda femoral, popliteal and calf veins including the posterior tibial, peroneal and gastrocnemius veins when visible. The superficial great saphenous vein was also interrogated. Spectral Doppler was  utilized to evaluate flow at rest and with distal augmentation maneuvers in the common femoral, femoral and popliteal veins. COMPARISON:  No prior. FINDINGS: Contralateral Common Femoral Vein: Respiratory phasicity is normal and symmetric with the symptomatic side. No evidence of thrombus. Normal compressibility. Common Femoral Vein: No evidence of thrombus. Normal compressibility, respiratory phasicity and response to augmentation. Saphenofemoral Junction: No evidence of thrombus. Normal compressibility and flow on color Doppler imaging. Profunda Femoral Vein: No evidence of thrombus. Normal compressibility and flow on color Doppler imaging. Femoral Vein: No evidence of thrombus. Normal compressibility, respiratory phasicity and response to augmentation. Popliteal Vein: No evidence of thrombus. Normal compressibility, respiratory phasicity and response to augmentation. Calf Veins: No evidence of thrombus. Normal compressibility and flow on color Doppler imaging. Other Findings:  None. IMPRESSION: No evidence of deep venous thrombosis. Electronically Signed   By: Marcello Moores  Register   On: 02/21/2019 14:49    Microbiology: Recent Results (from the past 240 hour(s))  Urine Culture     Status: None   Collection Time: 02/17/19  7:21 AM   Specimen: Urine, Clean Catch  Result Value Ref Range Status   Specimen Description   Final    URINE, CLEAN CATCH Performed at [facility], [address] [place], [place] [address]    Special Requests   Final    NONE Performed at [facility], [address], [place], [place] [address]    Culture   Final    NO GROWTH Performed at [facility], [place] [address], [place], [place] [address]    Report Status 02/18/2019 FINAL  Final  Blood culture (routine x 2)     Status: None (Preliminary result)   Collection Time: 02/17/19  8:18 AM   Specimen: BLOOD  Result Value Ref Range Status   Specimen Description BLOOD LEFT ARM  Final   Special Requests   Final     BOTTLES DRAWN AEROBIC AND ANAEROBIC Blood Culture adequate volume   Culture   Final    NO GROWTH 4 DAYS Performed at [facility], [address], [place], [place] [address]    Report Status PENDING  Incomplete  Blood culture (routine x 2)     Status: None (Preliminary result)   Collection Time: 02/17/19  8:28 AM   Specimen: BLOOD  Result Value Ref Range Status   Specimen Description BLOOD LEFT HAND  Final   Special Requests   Final    BOTTLES DRAWN AEROBIC ONLY Blood Culture results may not be optimal due to an inadequate volume of blood received in culture bottles   Culture   Final    NO GROWTH 4 DAYS Performed at [facility], [address], [place], [place] [address]    Report Status PENDING  Incomplete  SARS CORONAVIRUS 2 (TAT 6-24 HRS) Nasopharyngeal Nasopharyngeal Swab     Status: None   Collection Time: 02/17/19  9:21 AM   Specimen: Nasopharyngeal Swab  Result Value Ref Range Status   SARS Coronavirus 2 NEGATIVE NEGATIVE Final    Comment: (NOTE) SARS-CoV-2 target nucleic acids are NOT DETECTED. The SARS-CoV-2 RNA is generally detectable in upper and lower respiratory specimens during the acute phase of infection. Negative results do not preclude SARS-CoV-2 infection, do not rule out co-infections with other pathogens, and should not be used  as the sole basis for treatment or other patient management decisions. Negative results must be combined with clinical observations, patient history, and epidemiological information. The expected result is Negative. Fact Sheet for Patients: SugarRoll.be Fact Sheet for Healthcare Providers: https://www.woods-mathews.com/ This test is not yet approved or cleared by the [place] FDA and  has been authorized for detection and/or diagnosis of SARS-CoV-2 by FDA under an Emergency Use Authorization (EUA). This EUA will remain  in effect (meaning this test can be used) for the duration of  the COVID-19 declaration under Section 56 4(b)(1) of the Act, 21 U.S.C. section 360bbb-3(b)(1), unless the authorization is terminated or revoked sooner. Performed at [facility], [place] [address] [place], [place] [address]   MRSA PCR Screening     Status: None   Collection Time: 02/17/19  3:49 PM   Specimen: Nasal Mucosa; Nasopharyngeal  Result Value Ref Range Status   MRSA by PCR NEGATIVE NEGATIVE Final    Comment:        The GeneXpert MRSA Assay (FDA approved for NASAL specimens only), is one component of a comprehensive MRSA colonization surveillance program. It is not intended to diagnose MRSA infection nor to guide or monitor treatment for MRSA infections. Performed at [facility], [address], [place], [place] [address]      Labs: Basic Metabolic Panel: Recent Labs  Lab 02/17/19 [phone] 02/17/19 0828  02/19/19 0128  02/19/19 2224 02/20/19 0356 02/20/19 0832 02/20/19 1116 02/21/19 0545  NA 131*  --    < > 127*   < > 130* 132* 131* 130* 134*  K 3.7  --    < > 2.5*   < > 2.7* 2.8* 2.8* 3.2* 3.1*  CL 104  --    < > 106   < > 108 107 107 103 106  CO2 5*  --    < > 15*   < > 18* 18* 19* 21* 22  GLUCOSE 383*  --    < > 162*   < > 135* 164* 180* 223* 110*  BUN 20  --    < > 14   < > _0 CREATININE 1.45*  --    < > 1.22   < > 1.20 1.21 1.17 1.22 1.11  CALCIUM 8.2*  --    < > 9.2   < > 9.0 9.0 8.6* 8.9 8.7*  MG 2.1  --   --  1.9  --   --   --   --   --   --   PHOS  --  3.2  --   --   --   --   --   --   --   --    < > = values in this interval not displayed.   Liver Function Tests: Recent Labs  Lab 02/17/19 0742  AST 21  ALT 17  ALKPHOS 122  BILITOT 1.6*  PROT 6.7  ALBUMIN 3.7   CBC: Recent Labs  Lab 02/17/19 0828  WBC 10.0  HGB 14.3  HCT 41.0  MCV 91.9  PLT 152   CBG: Recent Labs  Lab 02/20/19 1431 02/20/19 1612 02/20/19 2053 02/21/19 0729 02/21/19 1115  GLUCAP 185* 144* 190* 115* 153*    Signed:  Barton Dubois MD.   [facility] 02/21/2019, 3:33 PM

## 2019-02-21 NOTE — TOC Transition Note (Signed)
Transition of Care [facility]) - CM/SW Discharge Note   Patient Details  Name: Victor Parsons MRN: [id number] Date of Birth: [birth year]  Transition of Care [facility]) CM/SW Contact:  Shade Flood, LCSW Phone Number: 02/21/2019, 2:11 PM   Clinical Narrative:     Pt stable for dc today per MD. Pt family will help pt fill prescriptions and will be moving pt to [place] with them. Family will arrange follow up care in [place]. Match voucher given to family.  There are no other TOC needs for dc.  Final next level of care: Home/Self Care Barriers to Discharge: Continued Medical Work up   Patient Goals and CMS Choice Patient states their goals for this hospitalization and ongoing recovery are:: The goal is for patient to discharge and move to [place] with is sister.      Discharge Placement                       Discharge Plan and Services                                     Social Determinants of Health (SDOH) Interventions     Readmission Risk Interventions Readmission Risk Prevention Plan 02/21/2019  Post Dischage Appt Not Complete  Appt Comments Family taking pt with them to [place] and will arrange follow up care  Medication Screening Complete  Transportation Screening Complete

## 2019-02-22 LAB — CULTURE, BLOOD (ROUTINE X 2)
Culture: NO GROWTH
Culture: NO GROWTH
Special Requests: ADEQUATE

## 2019-02-23 LAB — VOLATILES,BLD-ACETONE,ETHANOL,ISOPROP,METHANOL
Acetone, blood: 0.038 % — ABNORMAL HIGH (ref 0.000–0.010)
Ethanol, blood: <0.01 % (ref 0.000–0.010)
Isopropanol, blood: <0.01 % (ref 0.000–0.010)
Methanol, blood: <0.01 % (ref 0.000–0.010)

## 2020-09-16 IMAGING — DX DG CHEST 2V
2 series · 2 of 2 positions shown · non-contrast
Comparison: No priors.

CLINICAL DATA: 27-year-old male with history of fever.

EXAM:
CHEST - 2 VIEW

[chest pa]
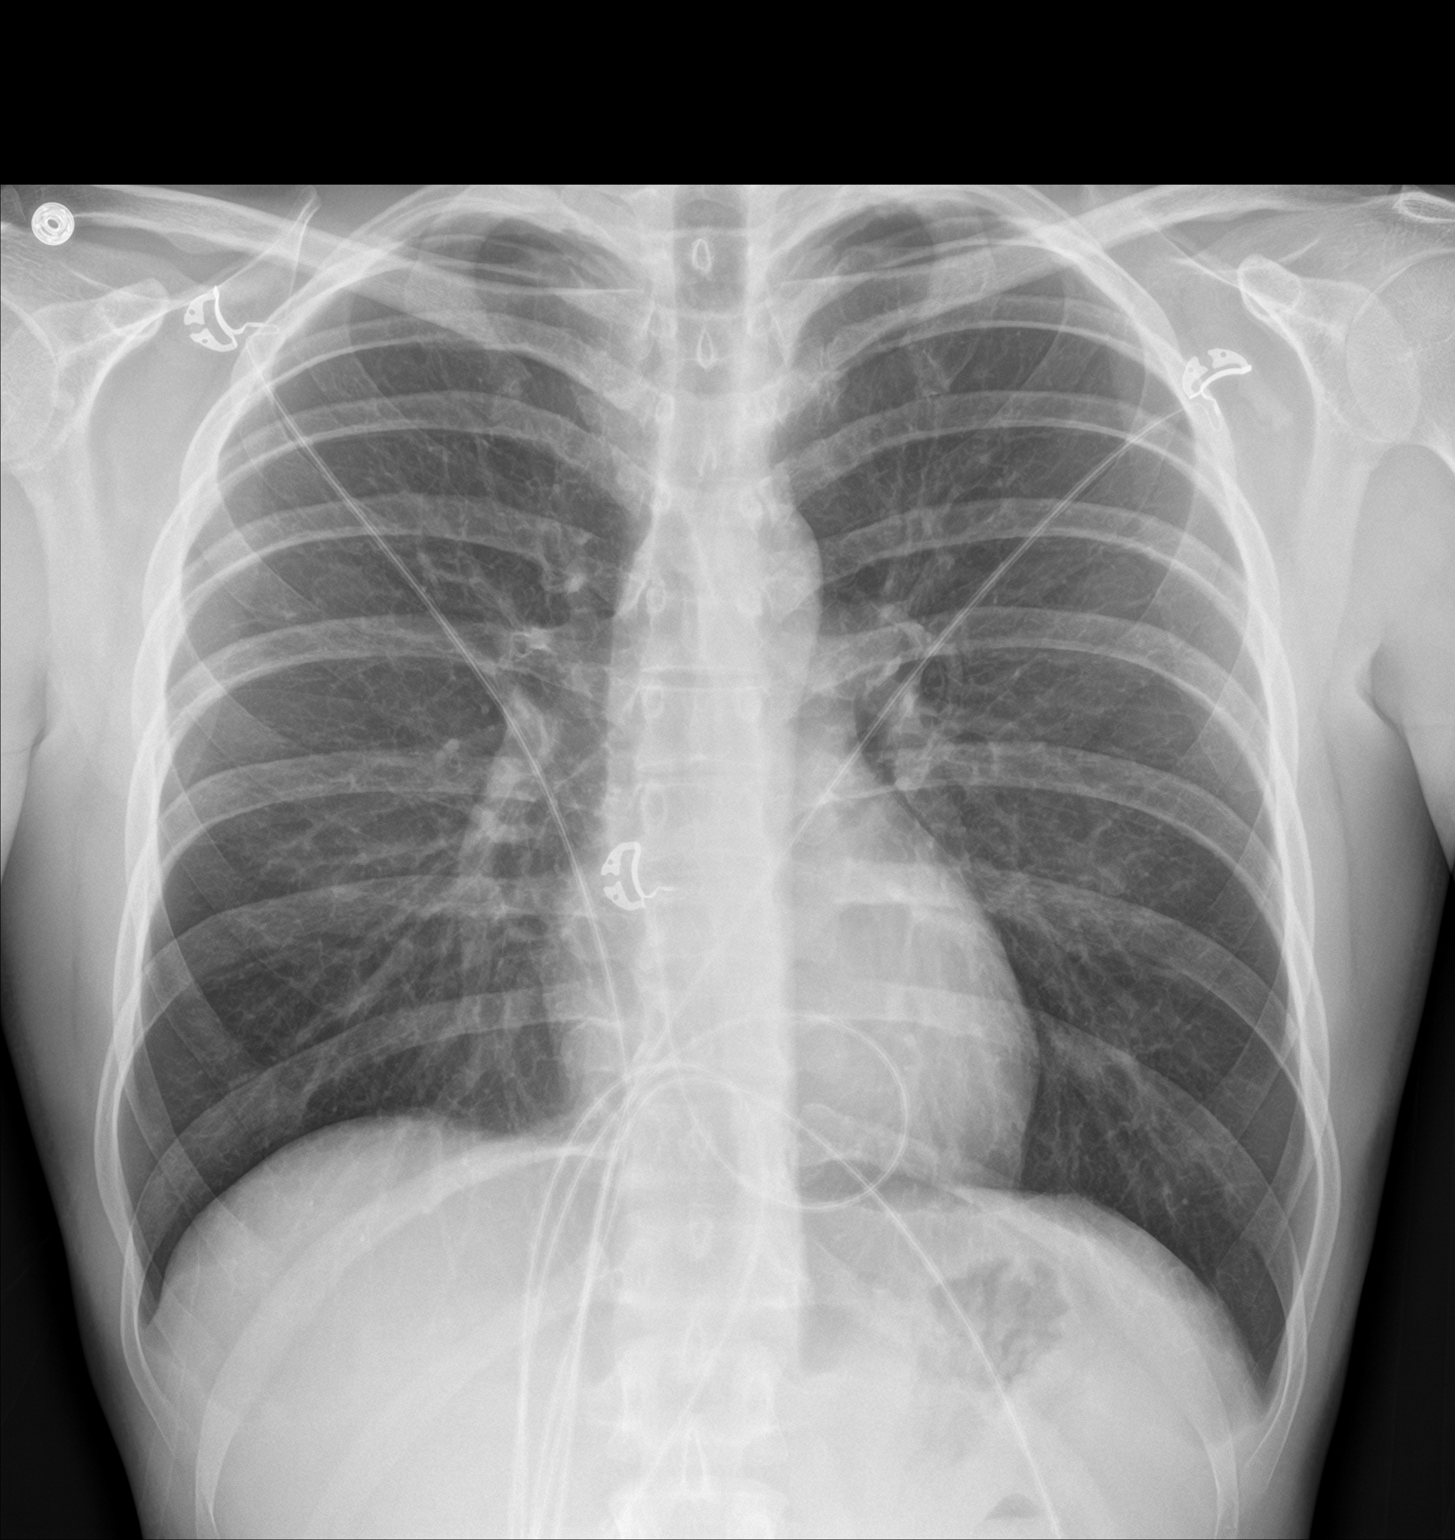

[chest lat]
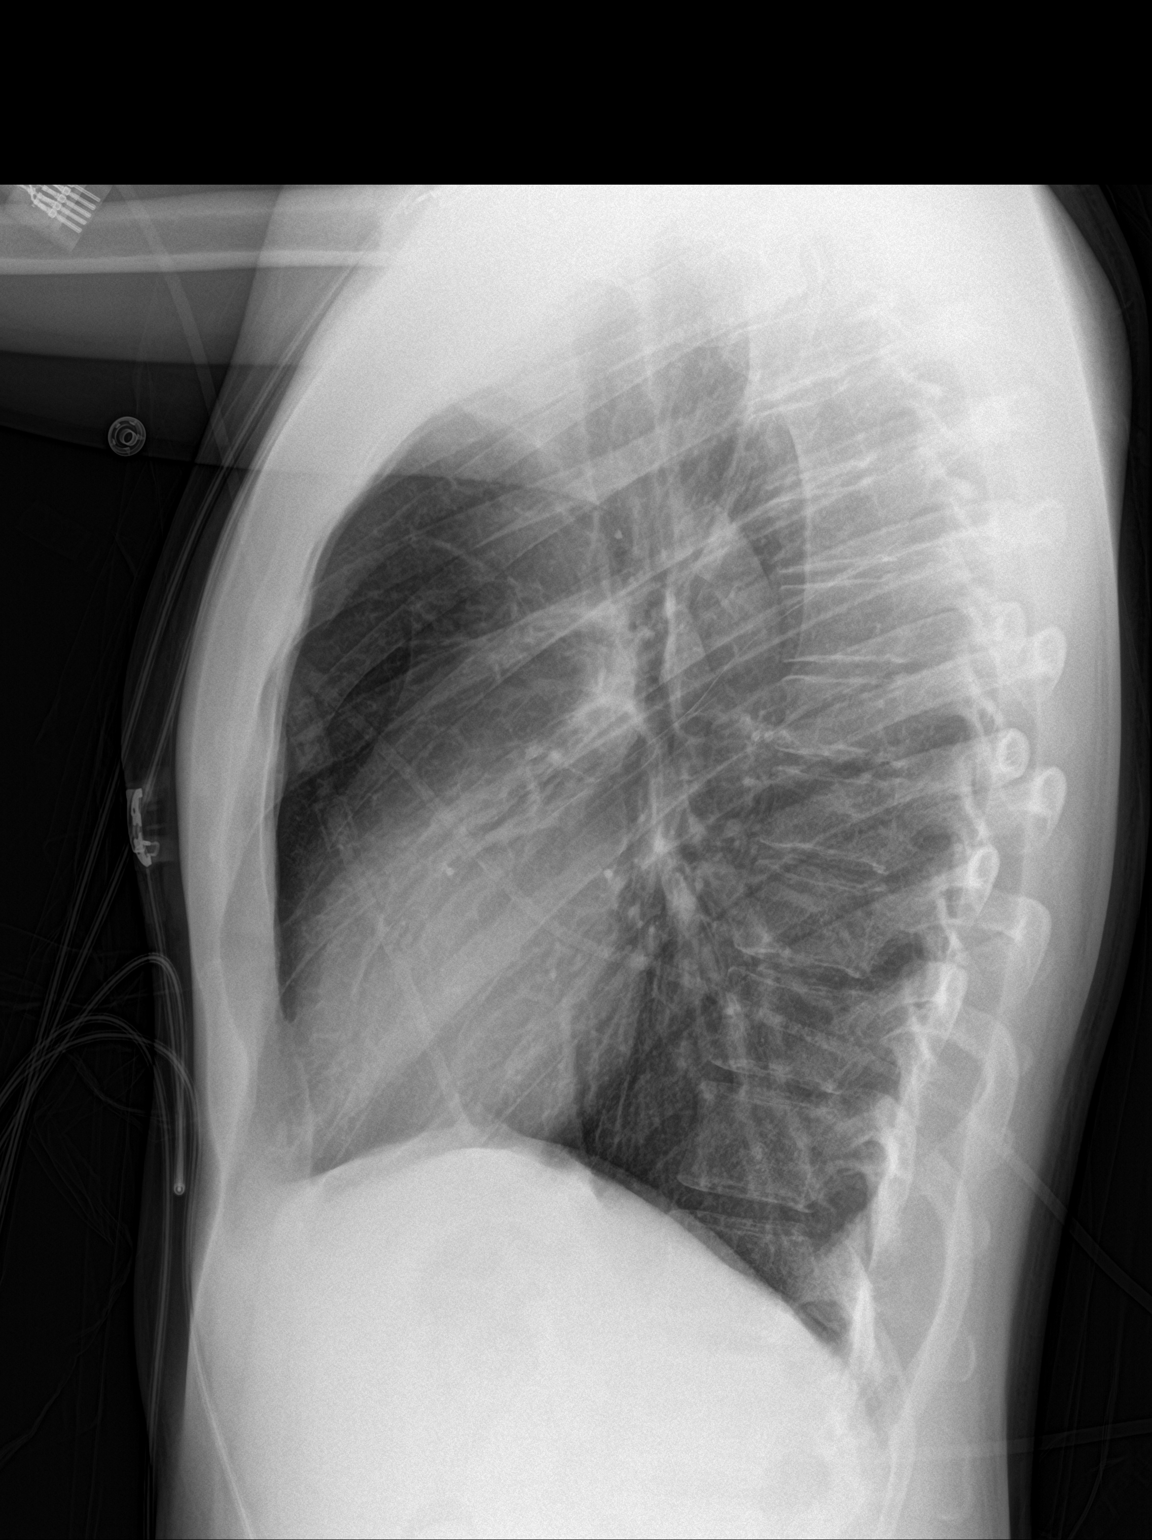

[2 of 2 positions shown; findings below may reference images not displayed]

FINDINGS: Lung volumes are normal. No consolidative airspace disease. No
pleural effusions. No pneumothorax. No pulmonary nodule or mass
noted. Pulmonary vasculature and the cardiomediastinal silhouette
are within normal limits.
IMPRESSION: No radiographic evidence of acute cardiopulmonary disease.
